# Patient Record
Sex: Female | Born: 1975 | Race: White | Hispanic: No | Marital: Married | Smoking: Former smoker
Health system: Southern US, Community
[De-identification: ages and names within clinical notes are randomized; demographics above are authoritative.]

## PROBLEM LIST (undated history)

## (undated) ENCOUNTER — Emergency Department (HOSPITAL_COMMUNITY): Admission: EM | Payer: Self-pay | Source: Home / Self Care

## (undated) DIAGNOSIS — R079 Chest pain, unspecified: Secondary | ICD-10-CM

## (undated) DIAGNOSIS — E785 Hyperlipidemia, unspecified: Secondary | ICD-10-CM

## (undated) DIAGNOSIS — Z803 Family history of malignant neoplasm of breast: Secondary | ICD-10-CM

## (undated) DIAGNOSIS — N809 Endometriosis, unspecified: Secondary | ICD-10-CM

## (undated) DIAGNOSIS — B019 Varicella without complication: Secondary | ICD-10-CM

## (undated) DIAGNOSIS — Z8041 Family history of malignant neoplasm of ovary: Secondary | ICD-10-CM

## (undated) HISTORY — DX: Family history of malignant neoplasm of breast: Z80.3

## (undated) HISTORY — DX: Endometriosis, unspecified: N80.9

## (undated) HISTORY — DX: Chest pain, unspecified: R07.9

## (undated) HISTORY — DX: Varicella without complication: B01.9

## (undated) HISTORY — DX: Family history of malignant neoplasm of ovary: Z80.41

## (undated) HISTORY — DX: Hyperlipidemia, unspecified: E78.5

---

## 2004-05-10 ENCOUNTER — Inpatient Hospital Stay (HOSPITAL_COMMUNITY): Admission: AD | Admit: 2004-05-10 | Discharge: 2004-05-10 | Payer: Self-pay | Admitting: Obstetrics and Gynecology

## 2004-08-01 ENCOUNTER — Emergency Department (HOSPITAL_COMMUNITY): Admission: EM | Admit: 2004-08-01 | Discharge: 2004-08-01 | Payer: Self-pay | Admitting: Emergency Medicine

## 2005-05-06 ENCOUNTER — Inpatient Hospital Stay (HOSPITAL_COMMUNITY): Admission: AD | Admit: 2005-05-06 | Discharge: 2005-05-06 | Payer: Self-pay | Admitting: Physical Therapy

## 2005-05-09 ENCOUNTER — Other Ambulatory Visit: Admission: RE | Admit: 2005-05-09 | Discharge: 2005-05-09 | Payer: Self-pay | Admitting: Obstetrics and Gynecology

## 2005-08-03 ENCOUNTER — Inpatient Hospital Stay (HOSPITAL_COMMUNITY): Admission: AD | Admit: 2005-08-03 | Discharge: 2005-08-03 | Payer: Self-pay | Admitting: Obstetrics and Gynecology

## 2005-10-26 ENCOUNTER — Inpatient Hospital Stay (HOSPITAL_COMMUNITY): Admission: AD | Admit: 2005-10-26 | Discharge: 2005-10-26 | Payer: Self-pay | Admitting: Obstetrics and Gynecology

## 2005-11-18 ENCOUNTER — Inpatient Hospital Stay (HOSPITAL_COMMUNITY): Admission: AD | Admit: 2005-11-18 | Discharge: 2005-11-22 | Payer: Self-pay | Admitting: Obstetrics and Gynecology

## 2006-01-01 ENCOUNTER — Other Ambulatory Visit: Admission: RE | Admit: 2006-01-01 | Discharge: 2006-01-01 | Payer: Self-pay | Admitting: Obstetrics and Gynecology

## 2006-09-11 ENCOUNTER — Emergency Department (HOSPITAL_COMMUNITY): Admission: EM | Admit: 2006-09-11 | Discharge: 2006-09-11 | Payer: Self-pay | Admitting: Family Medicine

## 2006-09-14 ENCOUNTER — Emergency Department (HOSPITAL_COMMUNITY): Admission: EM | Admit: 2006-09-14 | Discharge: 2006-09-15 | Payer: Self-pay | Admitting: Emergency Medicine

## 2007-12-19 ENCOUNTER — Inpatient Hospital Stay (HOSPITAL_COMMUNITY): Admission: AD | Admit: 2007-12-19 | Discharge: 2007-12-19 | Payer: Self-pay | Admitting: Obstetrics and Gynecology

## 2008-01-07 ENCOUNTER — Emergency Department (HOSPITAL_COMMUNITY): Admission: EM | Admit: 2008-01-07 | Discharge: 2008-01-07 | Payer: Self-pay | Admitting: Emergency Medicine

## 2008-06-16 ENCOUNTER — Inpatient Hospital Stay (HOSPITAL_COMMUNITY): Admission: AD | Admit: 2008-06-16 | Discharge: 2008-06-20 | Payer: Self-pay | Admitting: Obstetrics and Gynecology

## 2009-05-25 ENCOUNTER — Emergency Department (HOSPITAL_COMMUNITY): Admission: EM | Admit: 2009-05-25 | Discharge: 2009-05-25 | Payer: Self-pay | Admitting: Family Medicine

## 2009-06-03 ENCOUNTER — Emergency Department (HOSPITAL_COMMUNITY): Admission: EM | Admit: 2009-06-03 | Discharge: 2009-06-03 | Payer: Self-pay | Admitting: Emergency Medicine

## 2011-05-08 NOTE — Op Note (Signed)
Lindsay Sharp, Lindsay Sharp               ACCOUNT NO.:  [id number]   MEDICAL RECORD NO.:  [id number]          PATIENT TYPE:  INP   LOCATION:  9132                          FACILITY:  [facility]   PHYSICIAN:  Dineen Kid. Rana Snare, M.D.    DATE OF BIRTH:  [birth year]   DATE OF PROCEDURE:  DATE OF DISCHARGE:                               OPERATIVE REPORT   PREOPERATIVE DIAGNOSES:  Intrauterine pregnancy at 43 weeks' gestational  age, previous cesarean section, desired repeat breech presentation and  labor.   POSTOPERATIVE DIAGNOSES:  Intrauterine pregnancy at 57 weeks'  gestational age, previous cesarean section, desired repeat breech  presentation and labor.   PROCEDURE:  Repeat low-segment transverse cesarean section.   SURGEON:  Dineen Kid. Rana Snare, M.D.   ANESTHESIA:  Spinal.   INDICATIONS:  [name] is a 35 year old G3, P1 who presented in labor  with regular contractions, previous cesarean section, and breech,  desires repeat cesarean section.  Risks and benefits were discussed and  informed consent was obtained.   FINDINGS AT THE TIME OF SURGERY:  A viable female infant, Apgars were 8  and 8, and pH arterial 7.31.   DESCRIPTION OF PROCEDURE:  After adequate analgesia, the patient placed  in the supine position, left lateral tilt.  She was sterilely prepped  and draped.  The bladder was thoroughly drained with a Foley catheter.  The previous scar tissue was easily excised in an elliptical fashion.  Pfannenstiel skin incision was then taken down sharply to the fascia  which was incised transversely, extended superiorly and inferiorly off  the bellies of rectus muscle, which were separated and sharply in  midline.  The peritoneum was entered sharply.  Bladder flap created and  placed behind the bladder blade.  Low-segment myotomy incision was made  down to the amniotic sac.  Amniotomy was performed.  The infant's  buttocks were grasped, delivered in the frank breech position.  Arms  were easily  reduced, the hip delivered.  Atraumatically, the nares and  pharynx were then suctioned.  Cord clamped, cut, and the infant was  handed to the pediatricians for resuscitation.  Cord blood was obtained.  Placenta extracted manually.  Uterus was exteriorized, wiped clean with  a dry lap.  The myotomy incision was closed in 2 layers, the first being  running locking layer, the second being imbricating layer of suture of 0  Monocryl.  The uterus was placed back into the peritoneal cavity and  after copious amount of irrigation, adequate hemostasis was assured.  The peritoneum was closed with 0 Monocryl.  Rectus muscle plicated at  the midline.  Irrigation was applied and after adequate hemostasis, the  fascia was closed with #1 Vicryl in running fashion.  Irrigation was  applied and after adequate  hemostasis, skin staples, and Steri-Strips were applied.  The patient  tolerated the procedure well, was stable on transfer to recovery room.  Sponge and instrument count were normal x3.  Estimated blood loss was  900 mL.  The patient did receive 900 mg of clindamycin preoperatively.      Dineen Kid Rana Snare, M.D.  Electronically Signed     DCL/MEDQ  D:  06/16/2008  T:  06/17/2008  Job:  [id number]

## 2011-05-08 NOTE — H&P (Signed)
Lindsay Sharp, DISS               ACCOUNT NO.:  [id number]   MEDICAL RECORD NO.:  [id number]          PATIENT TYPE:  INP   LOCATION:  9132                          [facility]   PHYSICIAN:  Dineen Kid. Rana Snare, M.D.    DATE OF BIRTH:  1976-08-06   DATE OF ADMISSION:  06/16/2008  DATE OF DISCHARGE:                              HISTORY & PHYSICAL   HISTORY OF PRESENT ILLNESS:  Ms. Squyres is a 35 year old G3, P1 at [redacted]  weeks gestational age, who presents in labor, contractions, and lower  back pain.  She has had a previous C-section, desires repeat.  She also  has breech presentation and is having regular contractions.  Her  estimated date of confinement is July 02, 2008.  She has had an  uncomplicated pregnancy.   PAST MEDICAL HISTORY:  Negative.   PAST SURGICAL HISTORY:  She has a history endometriosis and previous  laparoscopies.  Previous cesarean section failed to progress.   MEDICATIONS:  Prenatal vitamins.   ALLERGIES:  She is allergic to PENICILLIN and ERYTHROMYCIN.   PHYSICAL EXAMINATION:  Her blood pressure is 110/60.  Heart is regular  rate and rhythm.  Lungs, clear to auscultation bilaterally.  Abdomen is  gravid, nontender.  Cervix is closed, soft, 50% effaced.   IMPRESSION:  Intrauterine pregnancy at 38 weeks.  Previous cesarean  section, desired repeat and also breech presentation, now in early  labor.   PLAN:  Repeat low-segment transverse cesarean section.  Risks and  benefits were discussed at length.  Informed consent was obtained.      Dineen Kid Rana Snare, M.D.  Electronically Signed     DCL/MEDQ  D:  06/16/2008  T:  06/17/2008  Job:  [id number]

## 2011-05-11 NOTE — Discharge Summary (Signed)
Lindsay Sharp, Lindsay Sharp               ACCOUNT NO.:  [id number]   MEDICAL RECORD NO.:  [id number]          PATIENT TYPE:  INP   LOCATION:  9134                          [facility]   PHYSICIAN:  Michelle L. Grewal, M.D.DATE OF BIRTH:  02-22-1976   DATE OF ADMISSION:  11/18/2005  DATE OF DISCHARGE:  11/22/2005                                 DISCHARGE SUMMARY   ADMITTING DIAGNOSES:  1.  Intrauterine pregnancy at term.  2.  Spontaneous onset of labor.   DISCHARGE DIAGNOSES:  1.  Status post low transverse cesarean section secondary to failure to      progress and cephalopelvic disproportion.  2.  Viable female infant.   PROCEDURE:  Primary low transverse cesarean section.   REASON FOR ADMISSION:  Please see written H&P.   HOSPITAL COURSE:  The patient was a 35 year old gravida 2 para 0 that was  admitted to [facility] [place] with spontaneous onset of labor. The  patient was known to have positive group B beta strep and IV antibiotics  were administered prophylactically. On admission, vital signs were stable.  Contractions were noted to be irregular. Fetal heart tones were in the 140s  with accelerations. Cervix was noted to be 2 cm dilated, 50% effaced, vertex  at -3 station. Artificial rupture of membranes was performed which revealed  clear fluid. IV Pitocin was started to augment her labor. Epidural was  placed for the patient's comfort. The patient did progress to approximately  6 cm, 90%, with vertex molding at -1 to -2 station. Intrauterine pressure  catheter had been applied earlier in the day and had been noted to have  adequate labor for approximately 4 hours without progression of her labor.  Decision was made to proceed with a primary low transverse cesarean section.  The patient was then transferred to the operating room where epidural was  dosed to an adequate surgical level. A low transverse incision was made with  the delivery of a viable female weighing 8  pounds 0 ounces with Apgars of 8  at one minute and 9 at five minutes. Arterial cord pH was 7.31. The patient  tolerated the procedure well and was taken to the recovery room in stable  condition. On postoperative day #1 the patient was without complaint. Vital  signs were stable. Abdomen was soft with good return of bowel function.  Fundus was firm and nontender. Abdominal dressing was noted to have a small  amount of old drainage noted on the bandage. Laboratory findings revealed  hemoglobin of 11.3, a WBC count of 17.8. On postoperative day #2 the patient  was without complaint. Vital signs were stable. Abdomen was soft, fundus  firm and nontender. Abdominal dressing had been removed revealing an  incision that was clean, dry and intact. The patient was ambulating well and  tolerating a regular diet without complaints of nausea and vomiting. On  postoperative day #3 the patient was doing well. She was without complaint.  Vital signs remained stable. She was afebrile. Fundus was firm and  nontender. Incision was clean, dry and intact. Staples were removed and  the  patient was discharged home.   CONDITION ON DISCHARGE:  Good.   DIET:  Regular as tolerated.   ACTIVITY:  No heavy lifting, no driving x2 weeks, no vaginal entry.   FOLLOW UP:  The patient is to follow up in the office in 1 week for an  incision check. She is to call for temperature greater than 100 degrees,  persistent nausea and vomiting, heavy vaginal bleeding, and/or redness or  drainage from the incisional site.   DISCHARGE MEDICATIONS:  1.  Tylox #30 one p.o. every 4-6 hours p.r.n.  2.  Motrin 600 mg every 6 hours.  3.  Prenatal vitamins one p.o. daily.  4.  Colace one p.o. daily p.r.n.      Julio Sicks, N.P.      Stann Mainland. Vincente Poli, M.D.  Electronically Signed    CC/MEDQ  D:  01/10/2006  T:  01/10/2006  Job:  [id number]

## 2011-05-11 NOTE — Discharge Summary (Signed)
Lindsay Sharp, Lindsay Sharp               ACCOUNT NO.:  [id number]   MEDICAL RECORD NO.:  [id number]          PATIENT TYPE:  INP   LOCATION:  9132                          [facility]   PHYSICIAN:  Zelphia Cairo, MD    DATE OF BIRTH:  1976/04/06   DATE OF ADMISSION:  06/16/2008  DATE OF DISCHARGE:  06/20/2008                               DISCHARGE SUMMARY   ADMITTING DIAGNOSES:  1. Intrauterine pregnancy at 70 weeks estimated gestational age.  2. Previous cesarean section, desires repeat.  3. Breech presentation.  4. Spontaneous onset of labor.  5. History of postpartum depression.   DISCHARGE DIAGNOSES:  1. Status post low transverse cesarean section.  2. Viable female infant.   PROCEDURE:  Repeat low transverse cesarean section.   REASON FOR ADMISSION:  Please see written H&P.   HOSPITAL COURSE:  The patient was a 35 year old gravida 3, para 1 that  presented to [facility] at 38 weeks estimated  gestational age with spontaneous onset of labor.  The patient had had a  previous cesarean section and desired repeat.  The patient also was  noted to have a breech presentation.  The patient was then transferred  to the operating room where spinal anesthesia was administered without  difficulty.  A low transverse incision was made with delivery of a  viable female infant weighing 7 pounds 3 ounces with Apgars of 8 at 1  minute and 8 at 5 minutes.  Arterial cord pH was 7.31.  The patient  tolerated the procedure well and was taken to the recovery room in  stable condition.  On postoperative day #1, the patient was without  complaint.  She did state the baby was in the NICU on antibiotics on  room air.  Vital signs were stable.  She was afebrile.  Abdomen soft.  Abdominal dressing was noted to be clean, dry, and intact.  Laboratory  findings revealed a hemoglobin of 10.3.  On postoperative day #2, the  patient was without complaint.  Baby was stable in the NICU with  diagnosis of tachypnea.  Vital signs were stable.  She was afebrile.  Fundus firm and nontender.  Incision was clean, dry, and intact.  On  postoperative day #3, the patient was without complaint.  She denied any  pain or nausea and vomiting.  Baby continued to be in the NICU, stable.  Vital signs were stable.  Abdomen, soft.  She was ambulating well.  On  postoperative day #4, the patient was without complaint.  Vital signs  were stable.  She was afebrile.  Abdomen, soft.  Fundus firm and  nontender.  Incision was clean, dry, and intact.  Staples removed and  the patient was later discharged home.   CONDITION ON DISCHARGE:  Stable.   DIET:  Regular as tolerated.   ACTIVITY:  No heavy lifting.  No driving x2 weeks.  No vaginal entry.   FOLLOW UP:  The patient will follow up in the office in 1-2 weeks for  incision check.  She is to call for temperature greater than 100  degrees, persistent  nausea, vomiting, and heavy vaginal bleeding and/or  redness or drainage from the incisional site.   DISCHARGE MEDICATIONS:  1. Percocet 5/325 #30, one p.o. every 4-6 h. p.r.n.  2. Zoloft 50 mg one p.o. daily.  3. Prenatal vitamins one p.o. daily.  4. Ibuprofen 600 mg every 6 hours as needed.      Julio Sicks, N.P.      Zelphia Cairo, MD  Electronically Signed    CC/MEDQ  D:  07/11/2008  T:  07/12/2008  Job:  [id number]

## 2011-05-11 NOTE — Op Note (Signed)
Lindsay Sharp, Lindsay Sharp               ACCOUNT NO.:  [id number]   MEDICAL RECORD NO.:  [id number]          PATIENT TYPE:  INP   LOCATION:  9134                          [facility]   PHYSICIAN:  Freddy Finner, M.D.   DATE OF BIRTH:  October 08, 1976   DATE OF PROCEDURE:  11/19/2005  DATE OF DISCHARGE:                                 OPERATIVE REPORT   PREOPERATIVE DIAGNOSES:  1.  Intrauterine pregnancy at term.  2.  Failure to progress in labor due to cephalopelvic disproportion.   POSTOPERATIVE DIAGNOSES:  1.  Intrauterine pregnancy at term.  2.  Failure to progress in labor due to cephalopelvic disproportion.   OPERATIVE PROCEDURE:  Primary low transverse cervical cesarean section with  delivery of viable female infant, Apgars of 8 at one minute, 9 at five  minutes, arterial cord pH of 7.31.   ANESTHESIA:  Epidural.   ESTIMATED INTRAOPERATIVE BLOOD LOSS:  Less than or equal to 800 mL.   INTRAOPERATIVE COMPLICATIONS:  None.   Patient is a 35 year old who presented in early labor.  She is known to be  group B Strep positive.  She was treated with clindamycin IV intrapartum.  She was augmented with intravenous Pitocin and using intrauterine pressure  transducer was confirmed to have adequate labor of at least 200 [place]  units per hour for at least four hours.  Over that period of time she made  minimal cervical change and there was molding of the fetal vertex at a -1 to  -2 station.  Based on these findings it was elected to proceed with cesarean  delivery.  Epidural was then placed.  Foley catheter was then placed.  She  was brought to the operating room, dosed for surgery.  The abdomen was  prepped and draped in the usual fashion.  Low abdominal transverse incision  was made and carried sharply down to the fascia.  Fascia was entered sharply  and extended to the extent of skin incision.  Rectus sheath was developed  superiorly and inferiorly with blunt and sharp dissection.   Subcutaneous and  subfascial vessels were controlled with the Bovie.  Rectus muscles divided  in the midline.  Peritoneum was elevated and entered sharply and extended  bluntly to the extent of the skin incision.  Bladder blade was placed.  Transverse incision was made in the visceroperitoneum overlying the lower  uterine segment and the bladder bluntly dissected off the lower segment.  Transverse incision was made in the lower uterine segment, extended bluntly  in a transverse direction.  KIWI vacuum extractor was required for easy  delivery of the infant's head.  Single nuchal cord was reduced.  Apgars and  cord pH were noted above.  The shoulders were delivered without difficulty.  Cord blood was obtained for routine venous sampling for arterial sample.  The cord and placenta were donated for stem cell harvest.  Uterine cavity  was confirmed completely evacuated by manual exploration.  Uterus was  delivered onto the anterior abdominal wall.  Tubes and ovaries were normal  as was the uterus.  Uterine incision was  closed in a double layer with  running locking 0 Monocryl for the first layer and embrocating suture of 0  Monocryl for the second layer.  This effectively reapproximated the bladder  flap also.  Uterus, tubes, and ovaries were placed back into the abdominal  cavity.  Irrigation was carried out.  Hemostasis was complete.  All pack,  needle, and instrument counts were correct.  Abdominal incision was closed  in layers.  0 Monocryl was used to close the peritoneum and reapproximate  the  rectus muscles.  Fascia was closed with running 0 PDS.  Subcutaneous tissue  was approximated with running 2-0 plain.  Skin was closed with wide skin  staples and 1/4-inch Steri-Strips.  Patient tolerated the procedure well,  was taken to the recovery room in good condition.      Freddy Finner, M.D.  Electronically Signed     WRN/MEDQ  D:  11/20/2005  T:  11/20/2005  Job:  [id number]

## 2011-09-13 LAB — URINE MICROSCOPIC-ADD ON

## 2011-09-13 LAB — DIFFERENTIAL
Basophils Absolute: 0
Basophils Relative: 0
Eosinophils Absolute: 0.1
Eosinophils Relative: 1
Lymphocytes Relative: 27
Lymphs Abs: 3.3
Monocytes Absolute: 0.6
Monocytes Relative: 5
Neutro Abs: 8.2 — ABNORMAL HIGH

## 2011-09-13 LAB — I-STAT 8, (EC8 V) (CONVERTED LAB)
Bicarbonate: 22.8
HCT: 38
Hemoglobin: 12.9
Operator id: 234501
TCO2: 24
pCO2, Ven: 38.4 — ABNORMAL LOW

## 2011-09-13 LAB — URINALYSIS, ROUTINE W REFLEX MICROSCOPIC
Bilirubin Urine: NEGATIVE
Nitrite: NEGATIVE
Protein, ur: NEGATIVE
Specific Gravity, Urine: 1.015
Urobilinogen, UA: 0.2

## 2011-09-13 LAB — POCT I-STAT CREATININE
Creatinine, Ser: 0.7
Operator id: 234501

## 2011-09-13 LAB — CBC
RBC: 3.92
RDW: 13
WBC: 12.3 — ABNORMAL HIGH

## 2011-09-20 LAB — CBC
HCT: 29.2 — ABNORMAL LOW
HCT: 36.8
Hemoglobin: 10.3 — ABNORMAL LOW
Hemoglobin: 12.9
MCHC: 35
MCHC: 35.3
MCV: 92.7
MCV: 93.2
Platelets: 155
Platelets: 195
RBC: 3.14 — ABNORMAL LOW
RBC: 3.98
RDW: 12.6
RDW: 12.7
WBC: 8.3
WBC: 8.6

## 2011-09-20 LAB — RPR: RPR Ser Ql: NONREACTIVE

## 2011-09-28 LAB — URINALYSIS, ROUTINE W REFLEX MICROSCOPIC
Hgb urine dipstick: NEGATIVE
Ketones, ur: NEGATIVE
Protein, ur: NEGATIVE
Specific Gravity, Urine: 1.02

## 2012-09-26 ENCOUNTER — Ambulatory Visit (HOSPITAL_COMMUNITY)
Admission: RE | Admit: 2012-09-26 | Discharge: 2012-09-26 | Disposition: A | Discharge status: Home / Self Care | Payer: BC Managed Care – PPO | Source: Ambulatory Visit | Attending: *Deleted | Admitting: *Deleted

## 2012-09-26 ENCOUNTER — Other Ambulatory Visit (HOSPITAL_COMMUNITY): Payer: Self-pay | Admitting: *Deleted

## 2012-09-26 ENCOUNTER — Other Ambulatory Visit (HOSPITAL_COMMUNITY): Payer: Self-pay | Admitting: Family Medicine

## 2012-09-26 DIAGNOSIS — R109 Unspecified abdominal pain: Secondary | ICD-10-CM | POA: Insufficient documentation

## 2012-09-26 DIAGNOSIS — R1084 Generalized abdominal pain: Secondary | ICD-10-CM

## 2012-09-26 MED ORDER — IOHEXOL 300 MG/ML  SOLN
100.0000 mL | Freq: Once | INTRAMUSCULAR | Status: AC | PRN
  Administered 2012-09-26: 100 mL via INTRAVENOUS

## 2012-09-26 NOTE — ED Notes (Signed)
Not in triage, unable to find.

## 2012-09-26 NOTE — ED Notes (Signed)
Pt waiting on CT. Ct aware and is going to look at order

## 2012-09-29 ENCOUNTER — Other Ambulatory Visit (HOSPITAL_COMMUNITY): Payer: Self-pay

## 2013-05-13 ENCOUNTER — Inpatient Hospital Stay (HOSPITAL_COMMUNITY)
Admission: AD | Admit: 2013-05-13 | Discharge: 2013-05-13 | Discharge status: Against Medical Advice | Payer: BC Managed Care – PPO | Source: Ambulatory Visit | Attending: Obstetrics and Gynecology | Admitting: Obstetrics and Gynecology

## 2013-05-13 NOTE — MAU Note (Signed)
Not in lobby

## 2013-09-19 ENCOUNTER — Emergency Department: Payer: Self-pay | Admitting: Emergency Medicine

## 2013-09-19 LAB — URINALYSIS, COMPLETE
Ketone: NEGATIVE
Nitrite: NEGATIVE
Protein: NEGATIVE
RBC,UR: <1 /HPF (ref 0–5)
Squamous Epithelial: 4

## 2014-08-27 ENCOUNTER — Encounter: Payer: Self-pay | Admitting: Cardiovascular Disease

## 2014-08-27 ENCOUNTER — Ambulatory Visit (INDEPENDENT_AMBULATORY_CARE_PROVIDER_SITE_OTHER): Payer: BC Managed Care – PPO | Admitting: Cardiovascular Disease

## 2014-08-27 VITALS — BP 124/84 | HR 59 | Ht 68.0 in | Wt 176.0 lb

## 2014-08-27 DIAGNOSIS — R079 Chest pain, unspecified: Secondary | ICD-10-CM | POA: Insufficient documentation

## 2014-08-27 DIAGNOSIS — E785 Hyperlipidemia, unspecified: Secondary | ICD-10-CM | POA: Insufficient documentation

## 2014-08-27 DIAGNOSIS — R072 Precordial pain: Secondary | ICD-10-CM

## 2014-08-27 NOTE — Assessment & Plan Note (Signed)
On statin therapy followed by her PCP 

## 2014-08-27 NOTE — Patient Instructions (Signed)
Your physician recommends that you schedule a follow-up appointment in: 6 Months  Your physician has requested that you have an exercise tolerance test. For further information please visit [email or web address] Please also follow instruction sheet, as given.

## 2014-08-27 NOTE — Progress Notes (Signed)
08/27/2014 Lindsay Sharp   09-Dec-1976  [id number]  Primary Physician No PCP Per Patient Primary Cardiologist: Lindsay Gess MD Roseanne Reno   HPI:  Ms. 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 She was referred by [facility]  urgent care for cardiovascular evaluation because of chest pain and positive risk factors. Her cardiovascular risk factor profile is notable for 10 pack years of tobacco abuse having quit 10 years ago. History of hyperlipidemia. There is no family history of heart disease. She has never had a heart attack or stroke. She's had chest pain for last 2 years occurring several times a month lasting one to 2 minutes at a time. The pain is left inframammary without other associated symptoms.   Current Outpatient Prescriptions  Medication Sig Dispense Refill  . aspirin 81 MG tablet Take 81 mg by mouth daily.      Marland Kitchen atorvastatin (LIPITOR) 40 MG tablet Take 40 mg by mouth daily.      . Coenzyme Q10 (CO Q-10) 200 MG CAPS Take 200 mg by mouth daily.      . norethindrone-ethinyl estradiol (JUNEL FE,GILDESS FE,LOESTRIN FE) 1-20 MG-MCG tablet Take 1 tablet by mouth daily.      . Omega-3 Fatty Acids (FISH OIL) 1000 MG CAPS Take 2,000 mg by mouth daily.      . polyethylene glycol (MIRALAX / GLYCOLAX) packet Take 17 g by mouth daily.       No current facility-administered medications for this visit.    Allergies  Allergen Reactions  . Erythromycin Nausea And Vomiting  . Penicillins Hives    History   Social History  . Marital Status: Married    Spouse Name: N/A    Number of Children: N/A  . Years of Education: N/A   Occupational History  . Not on file.   Social History Main Topics  . Smoking status: Former Smoker    Quit date: 08/27/2004  . Smokeless tobacco: Not on file  . Alcohol Use: Not on file  . Drug Use:  Not on file  . Sexual Activity: Not on file   Other Topics Concern  . Not on file   Social History Narrative  . No narrative on file     Review of Systems: General: negative for chills, fever, night sweats or weight changes.  Cardiovascular: negative for chest pain, dyspnea on exertion, edema, orthopnea, palpitations, paroxysmal nocturnal dyspnea or shortness of breath Dermatological: negative for rash Respiratory: negative for cough or wheezing Urologic: negative for hematuria Abdominal: negative for nausea, vomiting, diarrhea, bright red blood per rectum, melena, or hematemesis Neurologic: negative for visual changes, syncope, or dizziness All other systems reviewed and are otherwise negative except as noted above.    Blood pressure 124/84, pulse 59, height  (1.727 m), weight 176 lb (79.833 kg).  General appearance: alert and no distress Neck: no adenopathy, no carotid bruit, no JVD, supple, symmetrical, trachea midline and thyroid not enlarged, symmetric, no tenderness/mass/nodules Lungs: clear to auscultation bilaterally Heart: regular rate and rhythm, S1, S2 normal, no murmur, click, rub or gallop Extremities: extremities normal, atraumatic, no cyanosis or edema and 2+ pedal pulses bilaterally  EKG sinus bradycardia at 59 without ST or T wave changes  ASSESSMENT AND PLAN:   Hyperlipidemia On statin therapy followed by her PCP  Chest pain 38 year old female with hyperlipidemia and chest  pain for last 2 years occurring several times a month. The pain is left inframammary and lasting several minutes at a time. Nothing in particular brings it on. There has been occasional left upper extremity radiation. I think her prior probability of CAD as low, I believe it's worth pursuing an exercise stress test.      Lindsay Gess MD [facility] 08/27/2014 8:29 AM

## 2014-08-27 NOTE — Assessment & Plan Note (Signed)
38 year old female with hyperlipidemia and chest pain for last 2 years occurring several times a month. The pain is left inframammary and lasting several minutes at a time. Nothing in particular brings it on. There has been occasional left upper extremity radiation. I think her prior probability of CAD as low, I believe it's worth pursuing an exercise stress test.

## 2014-11-03 ENCOUNTER — Encounter (HOSPITAL_COMMUNITY): Payer: BC Managed Care – PPO

## 2014-12-01 ENCOUNTER — Ambulatory Visit: Payer: BC Managed Care – PPO | Admitting: Cardiovascular Disease

## 2015-01-14 ENCOUNTER — Telehealth: Payer: Self-pay | Admitting: Genetic Counselor

## 2015-01-14 NOTE — Telephone Encounter (Signed)
LEFT MESSAGE FOR PATIENT TO RETURN CALL TO SCHEDULE GENETIC APPT.  °

## 2015-01-14 NOTE — Telephone Encounter (Signed)
S/W PATIIENT AND GAVE GENETIC APPT FOR 02/22 @ 2 Mariea ClontsW/KAREN Lowell GuitarPOWELL

## 2015-02-07 ENCOUNTER — Encounter: Payer: Self-pay | Admitting: Genetic Counselor

## 2015-02-07 ENCOUNTER — Other Ambulatory Visit: Payer: BC Managed Care – PPO

## 2015-02-07 ENCOUNTER — Ambulatory Visit (HOSPITAL_BASED_OUTPATIENT_CLINIC_OR_DEPARTMENT_OTHER): Payer: BC Managed Care – PPO | Admitting: Genetic Counselor

## 2015-02-07 DIAGNOSIS — Z315 Encounter for genetic counseling: Secondary | ICD-10-CM

## 2015-02-07 DIAGNOSIS — Z803 Family history of malignant neoplasm of breast: Secondary | ICD-10-CM

## 2015-02-07 DIAGNOSIS — Z808 Family history of malignant neoplasm of other organs or systems: Secondary | ICD-10-CM

## 2015-02-07 DIAGNOSIS — N809 Endometriosis, unspecified: Secondary | ICD-10-CM | POA: Insufficient documentation

## 2015-02-07 DIAGNOSIS — Z8041 Family history of malignant neoplasm of ovary: Secondary | ICD-10-CM

## 2015-02-07 NOTE — Progress Notes (Signed)
REFERRING PROVIDER: Luz Lex, MD [address] [address] [place], [place] [address]  PRIMARY PROVIDER:  No PCP Per Patient  PRIMARY REASON FOR VISIT:  1. Family history of breast cancer   2. Family history of ovarian cancer   3. Family history of melanoma      HISTORY OF PRESENT ILLNESS:   Lindsay Sharp, a 39 y.o. female, was seen for a [place] cancer genetics consultation at the request of Dr. Corinna Capra due to a family history of cancer.  Lindsay Sharp presents to clinic today to discuss the possibility of a hereditary predisposition to cancer, genetic testing, and to further clarify her future cancer risks, as well as potential cancer risks for family members.   Lindsay Sharp has no personal history of cancer.  She was diagnosed with endometriosis, and her physician has discussed a hysterectomy with her based on this diagnosis.  CANCER HISTORY:   No history exists.     HORMONAL RISK FACTORS:  Menarche was at age 21.  First live birth at age 33.  OCP use for approximately 17 years.  Ovaries intact: yes.  Hysterectomy: no.  Menopausal status: premenopausal.  HRT use: 0 years. Colonoscopy: scheduled for February 22, 2015 due to fullness on her right side Mammogram within the last year: Mammogram 1 year ago. Number of breast biopsies: 0. Up to date with pelvic exams:  yes. Any excessive radiation exposure in the past:  no  Past Medical History  Diagnosis Date  . Hyperlipidemia   . Chest pain   . Family history of ovarian cancer   . Family history of breast cancer   . Endometriosis     Past Surgical History  Procedure Laterality Date  . Cesarean section  2006 and 2009    History   Social History  . Marital Status: Married    Spouse Name: Carloyn Manner  . Number of Children: 2  . Years of Education: N/A   Social History Main Topics  . Smoking status: Former Smoker -- 1.00 packs/day for 10 years    Types: Cigarettes    Quit date: 08/27/2004  . Smokeless tobacco: Not on  file  . Alcohol Use: Yes     Comment: 1-2 glasses/week  . Drug Use: Not on file  . Sexual Activity: Not on file   Other Topics Concern  . None   Social History Narrative     FAMILY HISTORY:  We obtained a detailed, 4-generation family history.  Significant diagnoses are listed below: Family History  Problem Relation Age of Onset  . Hyperlipidemia Father   . Melanoma Father     dx in his 16s  . Hyperlipidemia Paternal Grandfather   . Breast cancer Paternal Aunt     dbl mastectomy; dx in her 7s  . Brain cancer Maternal Grandmother     dx in her 55s  . Prostate cancer Maternal Grandfather 57  . Ovarian cancer Paternal Grandmother     dx in her 75s   The patient has a paternal half brother who is cancer free.  Her mother had one brother who died in infancy.  Her maternal grandmother died in her 86s from a brain tumor and her grandfather at 42 from prostate cancer.  Her father was diagnosed with melanoma in his 110s.  He has two sisters and one brother.  One sister was diagnosed two years ago with breast cancer in one breast.  She had chemo and radiaiton and had a double mastectomy.  This  year her cancer has returned, but it is unknown if she has a metastises or if she has a second primary cancer.  Lindsay Sharp paternal grandmother had ovarian cancer in her 3s.  Her grandfather did not have cancer. Patient's maternal ancestors are of [place] descent, and paternal ancestors are of [place] descent. There is no reported Ashkenazi Jewish ancestry. There is no known consanguinity.  GENETIC COUNSELING ASSESSMENT: Lindsay Sharp is a 39 y.o. female with a family history of breast and ovarian cancer which somewhat suggestive of a hereditary cancer syndrome and predisposition to cancer. We, therefore, discussed and recommended the following at today's visit.   DISCUSSION: We reviewed the characteristics, features and inheritance patterns of hereditary cancer syndromes. We reviewed hereditary  cancer syndromes associated with breast and ovarian cancer, specifically BRCA mutations.  We also reviewed other hereditary breast/ovarian cancer syndromes.  We also discussed genetic testing, including the appropriate family members to test, the process of testing, insurance coverage and turn-around-time for results. We discussed the implications of a negative, positive and/or variant of uncertain significant result. We recommended Lindsay Sharp pursue genetic testing for the Breast/Ovarian cancer gene panel. The breast/ovarian cancer gene panel offered by [facility] examines the following 21 genes through sequencing and deletion/duplication analysis: ATM, BARD1, BRCA1, BRCA2, BRIP1, CDH1, CHEK2, EPCAM, FANCC, MLH1, MSH2, MSH6, NBN, PALB2, PMS2, PTEN, RAD51C, RAD51D, STK11, TP53, and XRCC2.    In order to estimate her chance of having a BRCA mutation, we used statistical models ([employer]) and laboratory data that take into account her personal medical history, family history and ancestry.  Because each model is different, there can be a lot of variability in the risks they give.  Therefore, these numbers must be considered a rough range and not a precise risk of having a BRCA mutation.  These models estimate that she has approximately a 2-2.4% chance of having a mutation. Based on this assessment of her family and personal history, genetic testing is recommended.  Based on the patient's personal and family history, statistical models (Tyrer Cusik)  and literature data were used to estimate her risk of developing breast cancer. These estimate her lifetime risk of developing breast cancer to be approximately 19.8%. This estimation does not take into account any genetic testing results.  The patient's lifetime breast cancer risk is a preliminary estimate based on available information using one of several models endorsed by the [place] (ACS). The ACS recommends consideration of breast MRI  screening as an adjunct to mammography for patients at high risk (defined as 20% or greater lifetime risk). A more detailed breast cancer risk assessment can be considered, if clinically indicated.   PLAN: After considering the risks, benefits, and limitations,Lindsay Sharp  provided informed consent to pursue genetic testing and the blood sample was sent to [facility] for analysis of the Breast/Ovarian gene panel. Results should be available within approximately 3-4 weeks' time, at which point they will be disclosed by telephone to Lindsay Sharp, as will any additional recommendations warranted by these results. Lindsay Sharp will receive a summary of her genetic counseling visit and a copy of her results once available. This information will also be available in Epic. We encouraged Lindsay Sharp to remain in contact with cancer genetics annually so that we can continuously update the family history and inform her of any changes in cancer genetics and testing that may be of benefit for her family. Lindsay Sharp questions were answered to her satisfaction  today. Our contact information was provided should additional questions or concerns arise.  Lastly, we encouraged Lindsay Sharp to remain in contact with cancer genetics annually so that we can continuously update the family history and inform her of any changes in cancer genetics and testing that may be of benefit for this family.   Ms.  Sharp questions were answered to her satisfaction today. Our contact information was provided should additional questions or concerns arise. Thank you for the referral and allowing [place] to share in the care of your patient.   Neveyah Garzon P. Florene Glen, [place], [facility] Certified Genetic Counselor [email or web address] phone: [phone]  The patient was seen for a total of 60 minutes in face-to-face genetic counseling.  This patient was discussed with Drs. Magrinat, Lindi Adie and/or Burr Medico who agrees with the above.     _______________________________________________________________________ For Office Staff:  Number of people involved in session: 2 Was an Intern/ student involved with case: no

## 2015-02-14 ENCOUNTER — Encounter: Payer: BC Managed Care – PPO | Admitting: Genetic Counselor

## 2015-02-14 ENCOUNTER — Other Ambulatory Visit: Payer: BC Managed Care – PPO

## 2015-02-23 ENCOUNTER — Encounter: Payer: Self-pay | Admitting: Genetic Counselor

## 2015-02-23 ENCOUNTER — Telehealth: Payer: Self-pay | Admitting: Genetic Counselor

## 2015-02-23 DIAGNOSIS — Z1379 Encounter for other screening for genetic and chromosomal anomalies: Secondary | ICD-10-CM | POA: Insufficient documentation

## 2015-02-23 NOTE — Progress Notes (Signed)
HPI: Ms. Mauzy was previously seen in the [place] clinic due to a family history of cancer and concerns regarding a hereditary predisposition to cancer. Please refer to our prior cancer genetics clinic note for more information regarding Ms. Langlois's medical, social and family histories, and our assessment and recommendations, at the time. Ms. Yeats recent genetic test results were disclosed to her, as were recommendations warranted by these results. These results and recommendations are discussed in more detail below.  GENETIC TEST RESULTS: At the time of Ms. Stembridge's visit, we recommended she pursue genetic testing of the Breast/Ovarian cancer gene panel. The Breast/Ovarian gene panel offered by [facility] includes sequencing and rearrangement analysis for the following 21 genes:  ATM, BARD1, BRCA1, BRCA2, BRIP1, CDH1, CHEK2, EPCAM, FANCC, MLH1, MSH2, MSH6, NBN, PALB2, PMS2, PTEN, RAD51C, RAD51D, STK11, TP53, and XRCC2.   The report date is 02/22/2015.  Testing was performed at [facility] Genetic testing was normal, and did not reveal a deleterious mutation in these genes. The test report has been scanned into EPIC and is located under the Media tab.   We discussed with Ms. Prew that since the current genetic testing is not perfect, it is possible there may be a gene mutation in one of these genes that current testing cannot detect, but that chance is small. We also discussed, that it is possible that another gene that has not yet been discovered, or that we have not yet tested, is responsible for the cancer diagnoses in the family, and it is, therefore, important to remain in touch with cancer genetics in the future so that we can continue to offer Ms. Smyser the most up to date genetic testing.   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 At this time, it is unknown if this variant is associated with  increased cancer risk or if this is a normal finding, but most variants such as this get reclassified to being inconsequential. It should not be used to make medical management decisions. With time, we suspect the lab will determine the significance of this variant, if any. If we do learn more about it, we will try to contact Ms. Shidler to discuss it further. However, it is important to stay in touch with [place] periodically and keep the address and phone number up to date.   CANCER SCREENING RECOMMENDATIONS: This result is reassuring and suggests that Ms. Galeana's is not at risk for developing cancer due to an inherited predisposition associated with one of these genes. Most cancers happen by chance and this negative test, along with details of her family history, suggests that her cancer falls into this category. We, therefore, recommended she continue to follow the cancer management and screening guidelines provided by her oncology and primary providers.   RECOMMENDATIONS FOR FAMILY MEMBERS: Women in this family might be at some increased risk of developing cancer, over the general population risk, simply due to the family history of cancer. We recommended women in this family have a yearly mammogram beginning at age 39, or 77 years younger than the earliest onset of cancer, an an annual clinical breast exam, and perform monthly breast self-exams. Women in this family should also have a gynecological exam as recommended by their primary provider. All family members should have a colonoscopy by age 34.  FOLLOW-UP: Lastly, we discussed with Ms. Rollinson that cancer genetics is a rapidly advancing field and it is possible that new genetic  tests will be appropriate for her and/or her family members in the future. We encouraged her to remain in contact with cancer genetics on an annual basis so we can update her personal and family histories and let her know of advances in cancer genetics that may benefit this  family.   Our contact number was provided. Ms. Breach questions were answered to her satisfaction, and she knows she is welcome to call us at anytime with additional questions or concerns.   Roma Kayser, MS, [facility] Certified Genetic Counselor [email or web address]

## 2015-02-23 NOTE — Telephone Encounter (Signed)
Revealed negative genetic testing on the Breast/ovarian cancer panel.  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  Discussed that we will not change medical management based on this VUS.  We will follow up with her should this be reclassified.

## 2015-03-04 ENCOUNTER — Other Ambulatory Visit: Payer: Self-pay | Admitting: Gastroenterology

## 2015-03-04 DIAGNOSIS — R1031 Right lower quadrant pain: Secondary | ICD-10-CM

## 2015-03-14 ENCOUNTER — Ambulatory Visit
Admission: RE | Admit: 2015-03-14 | Discharge: 2015-03-14 | Disposition: A | Discharge status: Home / Self Care | Payer: BC Managed Care – PPO | Source: Ambulatory Visit | Attending: Gastroenterology | Admitting: Gastroenterology

## 2015-03-14 DIAGNOSIS — R1031 Right lower quadrant pain: Secondary | ICD-10-CM

## 2015-04-21 ENCOUNTER — Encounter: Payer: Self-pay | Admitting: Internal Medicine

## 2015-04-21 ENCOUNTER — Ambulatory Visit (INDEPENDENT_AMBULATORY_CARE_PROVIDER_SITE_OTHER): Payer: BC Managed Care – PPO | Admitting: Internal Medicine

## 2015-04-21 VITALS — BP 116/78 | HR 63 | Temp 98.1°F | Ht 67.0 in | Wt 173.0 lb

## 2015-04-21 DIAGNOSIS — N809 Endometriosis, unspecified: Secondary | ICD-10-CM

## 2015-04-21 DIAGNOSIS — E785 Hyperlipidemia, unspecified: Secondary | ICD-10-CM

## 2015-04-21 MED ORDER — ATORVASTATIN CALCIUM 40 MG PO TABS: 40.0000 mg | ORAL_TABLET | Freq: Every day | ORAL | Status: DC

## 2015-04-21 NOTE — Assessment & Plan Note (Signed)
Will check CMET and Lipid profile today Handout given on low fat diet Advised her to continue Lipitor, fish oil and CoQ10 Lipitor refilled today

## 2015-04-21 NOTE — Progress Notes (Signed)
Pre visit review using our clinic review tool, if applicable. No additional management support is needed unless otherwise documented below in the visit note. 

## 2015-04-21 NOTE — Progress Notes (Signed)
HPI  Pt presents to the clinic today to establish care and for management of the conditions listed below. [facility].  Flu: 10/2014 Tetanus: unsure LMP: 04/20/15 Pap Smear: 01/2015 Mammogram: 2014 Dentist: biannually  HLD: She takes Lipitor, Fish Oil and CoQ10 daily. She denies myalgias. She does try to consume a low fat diet.  Endometreosis: Causes her occasional pain. She will take Ibuprofen as needed for pain.  Past Medical History  Diagnosis Date  . Hyperlipidemia   . Chest pain   . Family history of ovarian cancer   . Family history of breast cancer   . Endometriosis     Current Outpatient Prescriptions  Medication Sig Dispense Refill  . aspirin 81 MG tablet Take 81 mg by mouth daily.    Marland Kitchen. atorvastatin (LIPITOR) 40 MG tablet Take 40 mg by mouth daily.    . Coenzyme Q10 (CO Q-10) 200 MG CAPS Take 200 mg by mouth daily.    . nitrofurantoin, macrocrystal-monohydrate, (MACROBID) 100 MG capsule Take 100 mg by mouth as needed.    . norethindrone-ethinyl estradiol (JUNEL FE,GILDESS FE,LOESTRIN FE) 1-20 MG-MCG tablet Take 1 tablet by mouth daily.    . Omega-3 Fatty Acids (FISH OIL) 1000 MG CAPS Take 2,000 mg by mouth daily.    . polyethylene glycol (MIRALAX / GLYCOLAX) packet Take 17 g by mouth daily.     No current facility-administered medications for this visit.    Allergies  Allergen Reactions  . Erythromycin Nausea And Vomiting  . Penicillins Hives    Family History  Problem Relation Age of Onset  . Hyperlipidemia Father   . Melanoma Father     dx in his 7250s  . Hyperlipidemia Paternal Grandfather   . Breast cancer Paternal Aunt     dbl mastectomy; dx in her 5550s  . Brain cancer Maternal Grandmother     dx in her 5160s  . Prostate cancer Maternal Grandfather [id number]  . Ovarian cancer Paternal Grandmother     dx in her 3360s    History   Social History  . Marital Status: Married    Spouse Name: Channing MuttersRoy  . Number of Children: 2   . Years of Education: N/A   Occupational History  . Not on file.   Social History Main Topics  . Smoking status: Former Smoker -- 1.00 packs/day for 10 years    Types: Cigarettes    Quit date: 08/27/2004  . Smokeless tobacco: Not on file  . Alcohol Use: Yes     Comment: 1-2 glasses/week  . Drug Use: Not on file  . Sexual Activity: Not on file   Other Topics Concern  . Not on file   Social History Narrative    ROS:  Constitutional: Denies fever, malaise, fatigue, headache or abrupt weight changes.  HEENT: Denies eye pain, eye redness, ear pain, ringing in the ears, wax buildup, runny nose, nasal congestion, bloody nose, or sore throat. Respiratory: Denies difficulty breathing, shortness of breath, cough or sputum production.   Cardiovascular: Denies chest pain, chest tightness, palpitations or swelling in the hands or feet.  Gastrointestinal: Denies abdominal pain, bloating, constipation, diarrhea or blood in the stool.  Skin: Denies redness, rashes, lesions or ulcercations.  Neurological: Denies dizziness, difficulty with memory, difficulty with speech or problems with balance and coordination.  Psych: Denies anxiety, depression, SI/HI.  No other specific complaints in a complete review of systems (except as listed in HPI above).  PE:  Ht  (1.702 m)  Wt 173 lb (78.472 kg)  BMI 27.09 kg/m2  LMP 04/20/2015 Wt Readings from Last 3 Encounters:  04/21/15 173 lb (78.472 kg)  08/27/14 176 lb (79.833 kg)    General: Appears her stated age, well developed, well nourished in NAD. HEENT: Head: normal shape and size; Eyes: sclera white, no icterus, conjunctiva pink, PERRLA and EOMs intact;  Cardiovascular: Normal rate and rhythm. S1,S2 noted.  No murmur, rubs or gallops noted.  Pulmonary/Chest: Normal effort and positive vesicular breath sounds. No respiratory distress. No wheezes, rales or ronchi noted.  Neurological: Alert and oriented.  Psychiatric: Mood and affect  normal. Behavior is normal. Judgment and thought content normal.    BMET    Component Value Date/Time   NA 136 01/07/2008 2050   K 3.8 01/07/2008 2050   CL 104 01/07/2008 2050   GLUCOSE 90 01/07/2008 2050   BUN 5* 01/07/2008 2050   CREATININE 0.7 01/07/2008 2049    Lipid Panel  No results found for: CHOL, TRIG, HDL, CHOLHDL, VLDL, LDLCALC  CBC    Component Value Date/Time   WBC 8.3 06/17/2008 0510   RBC 3.14* 06/17/2008 0510   HGB 10.3 DELTA CHECK NOTED* 06/17/2008 0510   HCT 29.2* 06/17/2008 0510   PLT 155 06/17/2008 0510   MCV 93.2 06/17/2008 0510   MCHC 35.3 06/17/2008 0510   RDW 12.7 06/17/2008 0510   LYMPHSABS 3.3 01/07/2008 2035   MONOABS 0.6 01/07/2008 2035   EOSABS 0.1 01/07/2008 2035   BASOSABS 0.0 01/07/2008 2035    Hgb A1C No results found for: HGBA1C   Assessment and Plan:

## 2015-04-21 NOTE — Assessment & Plan Note (Signed)
Controlled with Ibuprofen prn 

## 2015-04-21 NOTE — Patient Instructions (Signed)
Fat and Cholesterol Control Diet Fat and cholesterol levels in your blood and organs are influenced by your diet. High levels of fat and cholesterol may lead to diseases of the heart, small and large blood vessels, gallbladder, liver, and pancreas. CONTROLLING FAT AND CHOLESTEROL WITH DIET Although exercise and lifestyle factors are important, your diet is key. That is because certain foods are known to raise cholesterol and others to lower it. The goal is to balance foods for their effect on cholesterol and more importantly, to replace saturated and trans fat with other types of fat, such as monounsaturated fat, polyunsaturated fat, and omega-3 fatty acids. On average, a person should consume no more than 15 to 17 g of saturated fat daily. Saturated and trans fats are considered "bad" fats, and they will raise LDL cholesterol. Saturated fats are primarily found in animal products such as meats, butter, and cream. However, that does not mean you need to give up all your favorite foods. Today, there are good tasting, low-fat, low-cholesterol substitutes for most of the things you like to eat. Choose low-fat or nonfat alternatives. Choose round or loin cuts of red meat. These types of cuts are lowest in fat and cholesterol. Chicken (without the skin), fish, veal, and ground turkey breast are great choices. Eliminate fatty meats, such as hot dogs and salami. Even shellfish have little or no saturated fat. Have a 3 oz (85 g) portion when you eat lean meat, poultry, or fish. Trans fats are also called "partially hydrogenated oils." They are oils that have been scientifically manipulated so that they are solid at room temperature resulting in a longer shelf life and improved taste and texture of foods in which they are added. Trans fats are found in stick margarine, some tub margarines, cookies, crackers, and baked goods.  When baking and cooking, oils are a great substitute for butter. The monounsaturated oils are  especially beneficial since it is believed they lower LDL and raise HDL. The oils you should avoid entirely are saturated tropical oils, such as coconut and palm.  Remember to eat a lot from food groups that are naturally free of saturated and trans fat, including fish, fruit, vegetables, beans, grains (barley, rice, couscous, bulgur wheat), and pasta (without cream sauces).  IDENTIFYING FOODS THAT LOWER FAT AND CHOLESTEROL  Soluble fiber may lower your cholesterol. This type of fiber is found in fruits such as apples, vegetables such as broccoli, potatoes, and carrots, legumes such as beans, peas, and lentils, and grains such as barley. Foods fortified with plant sterols (phytosterol) may also lower cholesterol. You should eat at least 2 g per day of these foods for a cholesterol lowering effect.  Read package labels to identify low-saturated fats, trans fat free, and low-fat foods at the supermarket. Select cheeses that have only 2 to 3 g saturated fat per ounce. Use a heart-healthy tub margarine that is free of trans fats or partially hydrogenated oil. When buying baked goods (cookies, crackers), avoid partially hydrogenated oils. Breads and muffins should be made from whole grains (whole-wheat or whole oat flour, instead of "flour" or "enriched flour"). Buy non-creamy canned soups with reduced salt and no added fats.  FOOD PREPARATION TECHNIQUES  Never deep-fry. If you must fry, either stir-fry, which uses very little fat, or use non-stick cooking sprays. When possible, broil, bake, or roast meats, and steam vegetables. Instead of putting butter or margarine on vegetables, use lemon and herbs, applesauce, and cinnamon (for squash and sweet potatoes). Use nonfat   yogurt, salsa, and low-fat dressings for salads.  LOW-SATURATED FAT / LOW-FAT FOOD SUBSTITUTES Meats / Saturated Fat (g)  Avoid: Steak, marbled (3 oz/85 g) / 11 g  Choose: Steak, lean (3 oz/85 g) / 4 g  Avoid: Hamburger (3 oz/85 g) / 7  g  Choose: Hamburger, lean (3 oz/85 g) / 5 g  Avoid: Ham (3 oz/85 g) / 6 g  Choose: Ham, lean cut (3 oz/85 g) / 2.4 g  Avoid: Chicken, with skin, dark meat (3 oz/85 g) / 4 g  Choose: Chicken, skin removed, dark meat (3 oz/85 g) / 2 g  Avoid: Chicken, with skin, light meat (3 oz/85 g) / 2.5 g  Choose: Chicken, skin removed, light meat (3 oz/85 g) / 1 g Dairy / Saturated Fat (g)  Avoid: Whole milk (1 cup) / 5 g  Choose: Low-fat milk, 2% (1 cup) / 3 g  Choose: Low-fat milk, 1% (1 cup) / 1.5 g  Choose: Skim milk (1 cup) / 0.3 g  Avoid: Hard cheese (1 oz/28 g) / 6 g  Choose: Skim milk cheese (1 oz/28 g) / 2 to 3 g  Avoid: Cottage cheese, 4% fat (1 cup) / 6.5 g  Choose: Low-fat cottage cheese, 1% fat (1 cup) / 1.5 g  Avoid: Ice cream (1 cup) / 9 g  Choose: Sherbet (1 cup) / 2.5 g  Choose: Nonfat frozen yogurt (1 cup) / 0.3 g  Choose: Frozen fruit bar / trace  Avoid: Whipped cream (1 tbs) / 3.5 g  Choose: Nondairy whipped topping (1 tbs) / 1 g Condiments / Saturated Fat (g)  Avoid: Mayonnaise (1 tbs) / 2 g  Choose: Low-fat mayonnaise (1 tbs) / 1 g  Avoid: Butter (1 tbs) / 7 g  Choose: Extra light margarine (1 tbs) / 1 g  Avoid: Coconut oil (1 tbs) / 11.8 g  Choose: Olive oil (1 tbs) / 1.8 g  Choose: Corn oil (1 tbs) / 1.7 g  Choose: Safflower oil (1 tbs) / 1.2 g  Choose: Sunflower oil (1 tbs) / 1.4 g  Choose: Soybean oil (1 tbs) / 2.4 g  Choose: Canola oil (1 tbs) / 1 g Document Released: 12/10/2005 Document Revised: 04/06/2013 Document Reviewed: 03/10/2014 Exit[employer] Patient Information 2015 [employer]. This information is not intended to replace advice given to you by your health care provider. Make sure you discuss any questions you have with your health care provider.  

## 2015-04-22 LAB — LIPID PANEL
CHOLESTEROL: 140 mg/dL (ref 0–200)
HDL: 51.5 mg/dL (ref >39.00)
LDL CALC: 56 mg/dL (ref 0–99)
NonHDL: 88.5
Total CHOL/HDL Ratio: 3
Triglycerides: 164 mg/dL — ABNORMAL HIGH (ref 0.0–149.0)
VLDL: 32.8 mg/dL (ref 0.0–40.0)

## 2015-04-22 LAB — COMPREHENSIVE METABOLIC PANEL
ALK PHOS: 38 U/L — AB (ref 39–117)
ALT: 16 U/L (ref 0–35)
AST: 18 U/L (ref 0–37)
Albumin: 4.1 g/dL (ref 3.5–5.2)
BUN: 13 mg/dL (ref 6–23)
CALCIUM: 9.5 mg/dL (ref 8.4–10.5)
CO2: 25 meq/L (ref 19–32)
CREATININE: 0.75 mg/dL (ref 0.40–1.20)
Chloride: 103 mEq/L (ref 96–112)
GFR: 92.33 mL/min (ref >60.00)
GLUCOSE: 75 mg/dL (ref 70–99)
POTASSIUM: 3.8 meq/L (ref 3.5–5.1)
SODIUM: 137 meq/L (ref 135–145)
TOTAL PROTEIN: 7.2 g/dL (ref 6.0–8.3)
Total Bilirubin: 0.5 mg/dL (ref 0.2–1.2)

## 2015-05-06 ENCOUNTER — Telehealth: Payer: Self-pay | Admitting: Internal Medicine

## 2015-05-06 NOTE — Telephone Encounter (Signed)
Received fax from [facility]- they state they have no record of patient with that name or DOB

## 2015-08-25 ENCOUNTER — Encounter (INDEPENDENT_AMBULATORY_CARE_PROVIDER_SITE_OTHER): Payer: Self-pay

## 2015-08-25 ENCOUNTER — Encounter: Payer: Self-pay | Admitting: Family Medicine

## 2015-08-25 ENCOUNTER — Ambulatory Visit (INDEPENDENT_AMBULATORY_CARE_PROVIDER_SITE_OTHER): Payer: BC Managed Care – PPO | Admitting: Family Medicine

## 2015-08-25 VITALS — BP 110/76 | HR 65 | Temp 99.1°F | Ht 67.0 in | Wt 178.0 lb

## 2015-08-25 DIAGNOSIS — L237 Allergic contact dermatitis due to plants, except food: Secondary | ICD-10-CM

## 2015-08-25 MED ORDER — PREDNISONE 10 MG PO TABS: ORAL_TABLET | ORAL | Status: DC

## 2015-08-25 NOTE — Progress Notes (Signed)
Pre visit review using our clinic review tool, if applicable. No additional management support is needed unless otherwise documented below in the visit note. 

## 2015-08-25 NOTE — Progress Notes (Signed)
Subjective:  Patient ID: Lindsay Sharp, female    DOB: 1976/03/12  Age: 39 y.o. MRN: [id number]  CC: Rash, ? Poison oak/ivy  HPI:  39 year old female presents to clinic today with complaints of rash.  She reports that she went camping over the weekend and developed rash on Tuesday. The rash is located above her eye, left cheek, and left ear. She reports associated itching and some redness. She been using over-the-counter creams with some improvement in her itching. No associated fever, chills, nausea, vomiting. She reports the rash seemed to be exacerbated when she scratches it. She feels that that is secondary to poison oak or poison ivy especially in the setting of a recent camping trip.  Social Hx  Social History  Substance Use Topics  . Smoking status: Former Smoker -- 1.00 packs/day for 10 years    Types: Cigarettes    Quit date: 08/27/2004  . Smokeless tobacco: Never Used  . Alcohol Use: 0.0 oz/week    0 Standard drinks or equivalent per week     Comment: 1-2 glasses/week   Review of Systems  Constitutional: Negative for fever and chills.  Gastrointestinal: Negative for nausea and vomiting.  Skin: Positive for rash.    Objective:  BP 110/76 mmHg  Pulse 65  Temp(Src) 99.1 F (37.3 C) (Oral)  Ht  (1.702 m)  Wt 178 lb (80.74 kg)  BMI 27.87 kg/m2  SpO2 97%  LMP 08/11/2015  BP/Weight 08/25/2015 04/21/2015 08/27/2014  Systolic BP 110 116 124  Diastolic BP 76 78 84  Wt. (Lbs) 178 173 176  BMI 27.87 27.09 26.77   Physical Exam  Constitutional: She is oriented to person, place, and time. She appears well-developed and well-nourished. No distress.  Cardiovascular: Normal rate and regular rhythm.   No murmur heard. Pulmonary/Chest: Effort normal and breath sounds normal. No respiratory distress. She has no wheezes. She has no rales.  Neurological: She is alert and oriented to person, place, and time.  Skin:  Erythematous papular rash noted above left eye, left cheek  and on left ear.  Left ear with some drainage and crusting.  Psychiatric: She has a normal mood and affect.   Lab Results  Component Value Date   WBC 8.3 06/17/2008   HGB 10.3 DELTA CHECK NOTED* 06/17/2008   HCT 29.2* 06/17/2008   PLT 155 06/17/2008   GLUCOSE 75 04/21/2015   CHOL 140 04/21/2015   TRIG 164.0* 04/21/2015   HDL 51.50 04/21/2015   LDLCALC 56 04/21/2015   ALT 16 04/21/2015   AST 18 04/21/2015   NA 137 04/21/2015   K 3.8 04/21/2015   CL 103 04/21/2015   CREATININE 0.75 04/21/2015   BUN 13 04/21/2015   CO2 25 04/21/2015    Assessment & Plan:   Problem List Items Addressed This Visit    Poison ivy - Primary    New problem.  Rash consistent with poison oak/ivy. Treating with prednisone taper. Rx given today.        Meds ordered this encounter  Medications  . predniSONE (DELTASONE) 10 MG tablet    Sig: 50 mg (5 tablets) daily x 3 days, then 40 mg (4 tablets) daily x 3 days, then 30 mg (3 tablets) daily x 3 days, then 20 mg (2 tablets) daily x 3 days, then 10 mg (1 tablet) daily x 3 days.    Dispense:  45 tablet    Refill:  0    Follow-up: PRN.    Everlene Other,  DO

## 2015-08-25 NOTE — Patient Instructions (Signed)
Take the prednisone as prescribed. ° °Follow up as needed. ° °Take care ° °Dr. Alani Sabbagh  °

## 2015-08-25 NOTE — Assessment & Plan Note (Signed)
New problem.  Rash consistent with poison oak/ivy. Treating with prednisone taper. Rx given today.

## 2016-02-10 ENCOUNTER — Ambulatory Visit (INDEPENDENT_AMBULATORY_CARE_PROVIDER_SITE_OTHER): Payer: BC Managed Care – PPO | Admitting: Family Medicine

## 2016-02-10 ENCOUNTER — Encounter: Payer: Self-pay | Admitting: Family Medicine

## 2016-02-10 VITALS — BP 120/88 | HR 60 | Temp 99.0°F | Ht 67.0 in | Wt 177.4 lb

## 2016-02-10 DIAGNOSIS — J988 Other specified respiratory disorders: Secondary | ICD-10-CM | POA: Insufficient documentation

## 2016-02-10 LAB — POCT INFLUENZA A/B
Influenza A, POC: NEGATIVE
Influenza B, POC: NEGATIVE

## 2016-02-10 MED ORDER — DOXYCYCLINE HYCLATE 100 MG PO TABS: 100.0000 mg | ORAL_TABLET | Freq: Two times a day (BID) | ORAL | Status: DC

## 2016-02-10 NOTE — Patient Instructions (Signed)
Your exam was negative.  This is likely viral.  IF you fail to improve, fill the antibiotic (Doxycycline).  Take care  Dr. Adriana Simas

## 2016-02-10 NOTE — Progress Notes (Signed)
Pre visit review using our clinic review tool, if applicable. No additional management support is needed unless otherwise documented below in the visit note. 

## 2016-02-10 NOTE — Progress Notes (Signed)
Subjective:  Patient ID: Lindsay Sharp, female    DOB: [birth year]  Age: 40 y.o. MRN: [id number]  CC: Fever, cough, sore throat  HPI:  40 year old female presents to clinic today with the above complaints.  Patient states that she's been sick since Saturday. She's had on and off fever, Tmax 101.7. She's also had congestion, cough, sore throat, drainage. No known exacerbating factors. No associated shortness of breath. No interventions tried. No other complaints today.  Social Hx   Social History   Social History  . Marital Status: Married    Spouse Name: Channing Mutters  . Number of Children: 2  . Years of Education: N/A   Social History Main Topics  . Smoking status: Former Smoker -- 1.00 packs/day for 10 years    Types: Cigarettes    Quit date: 08/27/2004  . Smokeless tobacco: Never Used  . Alcohol Use: 0.0 oz/week    0 Standard drinks or equivalent per week     Comment: 1-2 glasses/week  . Drug Use: No  . Sexual Activity: Yes   Other Topics Concern  . None   Social History Narrative   Review of Systems  Constitutional: Positive for fever.  HENT: Positive for congestion, postnasal drip and sore throat.   Respiratory: Positive for cough.    Objective:  BP 120/88 mmHg  Pulse 60  Temp(Src) 99 F (37.2 C) (Oral)  Ht  (1.702 m)  Wt 177 lb 6 oz (80.457 kg)  BMI 27.77 kg/m2  SpO2 98%  BP/Weight 02/10/2016 08/25/2015 04/21/2015  Systolic BP 120 110 116  Diastolic BP 88 76 78  Wt. (Lbs) 177.38 178 173  BMI 27.77 27.87 27.09   Physical Exam  Constitutional: She is oriented to person, place, and time. She appears well-developed. No distress.  HENT:  Head: Normocephalic and atraumatic.  Oropharynx with mild erythema. Normal TM's bilaterally.   Eyes: Conjunctivae are normal.  Neck: Neck supple.  Cardiovascular: Normal rate and regular rhythm.   Pulmonary/Chest: Effort normal and breath sounds normal. No respiratory distress. She has no wheezes. She has no rales.    Lymphadenopathy:    She has no cervical adenopathy.  Neurological: She is alert and oriented to person, place, and time.  Vitals reviewed.  Lab Results  Component Value Date   WBC 8.3 06/17/2008   HGB 10.3 DELTA CHECK NOTED* 06/17/2008   HCT 29.2* 06/17/2008   PLT 155 06/17/2008   GLUCOSE 75 04/21/2015   CHOL 140 04/21/2015   TRIG 164.0* 04/21/2015   HDL 51.50 04/21/2015   LDLCALC 56 04/21/2015   ALT 16 04/21/2015   AST 18 04/21/2015   NA 137 04/21/2015   K 3.8 04/21/2015   CL 103 04/21/2015   CREATININE 0.75 04/21/2015   BUN 13 04/21/2015   CO2 25 04/21/2015    Assessment & Plan:   Problem List Items Addressed This Visit    Respiratory infection - Primary    New problem. Exam unremarkable today. Rapid flu negative.  Likely viral in origin.  Rx for doxycycline given if she fails to improve or worsens.      Relevant Orders   POCT Influenza A/B (Completed)      Meds ordered this encounter  Medications  . doxycycline (VIBRA-TABS) 100 MG tablet    Sig: Take 1 tablet (100 mg total) by mouth 2 (two) times daily.    Dispense:  14 tablet    Refill:  0   Follow-up: PRN  Everlene Other DO [facility]  [place]

## 2016-02-10 NOTE — Assessment & Plan Note (Signed)
New problem. Exam unremarkable today. Rapid flu negative.  Likely viral in origin.  Rx for doxycycline given if she fails to improve or worsens.

## 2016-04-30 ENCOUNTER — Other Ambulatory Visit: Payer: Self-pay | Admitting: Internal Medicine

## 2016-06-22 ENCOUNTER — Encounter: Payer: Self-pay | Admitting: Internal Medicine

## 2016-06-22 ENCOUNTER — Ambulatory Visit (INDEPENDENT_AMBULATORY_CARE_PROVIDER_SITE_OTHER): Payer: BC Managed Care – PPO | Admitting: Internal Medicine

## 2016-06-22 VITALS — BP 114/68 | HR 55 | Temp 99.0°F | Wt 162.0 lb

## 2016-06-22 DIAGNOSIS — M79672 Pain in left foot: Secondary | ICD-10-CM | POA: Diagnosis not present

## 2016-06-22 DIAGNOSIS — E785 Hyperlipidemia, unspecified: Secondary | ICD-10-CM | POA: Diagnosis not present

## 2016-06-22 DIAGNOSIS — Z23 Encounter for immunization: Secondary | ICD-10-CM | POA: Diagnosis not present

## 2016-06-22 DIAGNOSIS — Z0000 Encounter for general adult medical examination without abnormal findings: Secondary | ICD-10-CM | POA: Diagnosis not present

## 2016-06-22 LAB — CBC
HEMATOCRIT: 37.8 % (ref 36.0–46.0)
Hemoglobin: 12.6 g/dL (ref 12.0–15.0)
MCHC: 33.2 g/dL (ref 30.0–36.0)
MCV: 91.3 fl (ref 78.0–100.0)
PLATELETS: 256 10*3/uL (ref 150.0–400.0)
RBC: 4.15 Mil/uL (ref 3.87–5.11)
RDW: 12.5 % (ref 11.5–15.5)
WBC: 7.9 10*3/uL (ref 4.0–10.5)

## 2016-06-22 LAB — LIPID PANEL
CHOLESTEROL: 112 mg/dL (ref 0–200)
HDL: 48.8 mg/dL (ref >39.00)
LDL Cholesterol: 47 mg/dL (ref 0–99)
NonHDL: 63.36
Total CHOL/HDL Ratio: 2
Triglycerides: 80 mg/dL (ref 0.0–149.0)
VLDL: 16 mg/dL (ref 0.0–40.0)

## 2016-06-22 LAB — COMPREHENSIVE METABOLIC PANEL
ALBUMIN: 4.3 g/dL (ref 3.5–5.2)
ALK PHOS: 30 U/L — AB (ref 39–117)
ALT: 13 U/L (ref 0–35)
AST: 14 U/L (ref 0–37)
BUN: 12 mg/dL (ref 6–23)
CALCIUM: 9.4 mg/dL (ref 8.4–10.5)
CO2: 27 mEq/L (ref 19–32)
CREATININE: 0.83 mg/dL (ref 0.40–1.20)
Chloride: 105 mEq/L (ref 96–112)
GFR: 81.62 mL/min (ref >60.00)
Glucose, Bld: 82 mg/dL (ref 70–99)
Potassium: 4.3 mEq/L (ref 3.5–5.1)
Sodium: 137 mEq/L (ref 135–145)
Total Bilirubin: 0.9 mg/dL (ref 0.2–1.2)
Total Protein: 7.2 g/dL (ref 6.0–8.3)

## 2016-06-22 NOTE — Progress Notes (Signed)
Pre visit review using our clinic review tool, if applicable. No additional management support is needed unless otherwise documented below in the visit note. 

## 2016-06-22 NOTE — Progress Notes (Signed)
   Subjective:    Patient ID: Lindsay Sharp, female    DOB: 03/06/1976, 40 y.o.   MRN: [id number]  HPI  erroneous encounter  Review of Systems       Objective:   Physical Exam        Assessment & Plan:

## 2016-06-22 NOTE — Addendum Note (Signed)
Addended by: Lorre MunroeBAITY, REGINA W on: 06/22/2016 02:12 PM   Modules accepted: Kipp BroodSmartSet

## 2016-06-22 NOTE — Progress Notes (Addendum)
Subjective:    Patient ID: Lindsay Sharp, female    DOB: [birth year], 40 y.o.   MRN: [id number]  HPI  Pt presents to the clinic today for her annual exam.  Seh does c/o pain in her left heel. She first noticed this about a month ago. The pain is sharp and stabbing. It is worse first thing in the morning and after running. She has not tried anything OTC for this.  Flu: 10/2015 Tetanus: > 10 years ago Pap Smear: 04/2016 HIV testing: 2005 Mammogram: 2014, resume at age 36 Dentist: biannually  Diet: She does eat meat. She consumes fruits and veggies daily. She tries to avoid fried foods. She drinks mostly water. Exercise: She is walking 5 days per week.  Review of Systems      Past Medical History  Diagnosis Date  . Hyperlipidemia   . Chest pain   . Family history of ovarian cancer   . Family history of breast cancer   . Endometriosis   . Chicken pox     Current Outpatient Prescriptions  Medication Sig Dispense Refill  . aspirin 81 MG tablet Take 81 mg by mouth daily. Reported on 02/10/2016    . atorvastatin (LIPITOR) 40 MG tablet Take 1 tablet (40 mg total) by mouth daily at 6 PM. MUST SCHEDULE ANNUAL EXAM FOR FURTHER REFILLS 90 tablet 0  . Coenzyme Q10 (CO Q-10) 200 MG CAPS Take 200 mg by mouth daily.    . norethindrone-ethinyl estradiol (JUNEL FE,GILDESS FE,LOESTRIN FE) 1-20 MG-MCG tablet Take 1 tablet by mouth daily.    . Omega-3 Fatty Acids (FISH OIL) 1000 MG CAPS Take 2,000 mg by mouth daily.    . polyethylene glycol (MIRALAX / GLYCOLAX) packet Take 17 g by mouth daily.     No current facility-administered medications for this visit.    Allergies  Allergen Reactions  . Erythromycin Nausea And Vomiting  . Penicillins Hives    Family History  Problem Relation Age of Onset  . Hyperlipidemia Father   . Melanoma Father     dx in his 45s  . Hyperlipidemia Paternal Grandfather   . Breast cancer Paternal Aunt     dbl mastectomy; dx in her 74s  . Brain cancer  Maternal Grandmother     dx in her 11s  . Prostate cancer Maternal Grandfather 55  . Ovarian cancer Paternal Grandmother     dx in her 45s    Social History   Social History  . Marital Status: Married    Spouse Name: Channing Mutters  . Number of Children: 2  . Years of Education: N/A   Occupational History  . Not on file.   Social History Main Topics  . Smoking status: Former Smoker -- 1.00 packs/day for 10 years    Types: Cigarettes    Quit date: 08/27/2004  . Smokeless tobacco: Never Used  . Alcohol Use: 0.0 oz/week    0 Standard drinks or equivalent per week     Comment: 1-2 glasses/week  . Drug Use: No  . Sexual Activity: Yes   Other Topics Concern  . Not on file   Social History Narrative     Constitutional: Denies fever, malaise, fatigue, headache or abrupt weight changes.  HEENT: Denies eye pain, eye redness, ear pain, ringing in the ears, wax buildup, runny nose, nasal congestion, bloody nose, or sore throat. Respiratory: Denies difficulty breathing, shortness of breath, cough or sputum production.   Cardiovascular: Denies chest pain, chest tightness, palpitations or  swelling in the hands or feet.  Gastrointestinal: Pt reports occasional reflux and constipation. Denies abdominal pain, bloating, diarrhea or blood in the stool.  GU: Denies urgency, frequency, pain with urination, burning sensation, blood in urine, odor or discharge. Musculoskeletal: Denies decrease in range of motion, difficulty with gait, muscle pain or joint pain and swelling.  Skin: Denies redness, rashes, lesions or ulcercations.  Neurological: Denies dizziness, difficulty with memory, difficulty with speech or problems with balance and coordination.  Psych: Denies anxiety, depression, SI/HI.  No other specific complaints in a complete review of systems (except as listed in HPI above).  Objective:   Physical Exam  BP 114/68 mmHg  Pulse 55  Temp(Src) 99 F (37.2 C) (Oral)  Wt 162 lb (73.483 kg)   SpO2 98%  LMP 02/23/2016 Wt Readings from Last 3 Encounters:  06/22/16 162 lb (73.483 kg)  06/22/16 162 lb 12 oz (73.823 kg)  02/10/16 177 lb 6 oz (80.457 kg)    General: Appears her stated age, well developed, well nourished in NAD. Skin: Warm, dry and intact. HEENT: Head: normal shape and size; Eyes: sclera white, no icterus, conjunctiva pink, PERRLA and EOMs intact; Ears: Tm's gray and intact, normal light reflex;Throat/Mouth: Teeth present, mucosa pink and moist, no exudate, lesions or ulcerations noted.  Neck:  Neck supple, trachea midline. No masses, lumps or thyromegaly present.  Cardiovascular: Normal rate and rhythm. S1,S2 noted.  No murmur, rubs or gallops noted. No JVD or BLE edema. Pulmonary/Chest: Normal effort and positive vesicular breath sounds. No respiratory distress. No wheezes, rales or ronchi noted.  Abdomen: Soft and nontender. Normal bowel sounds. No distention or masses noted. Liver, spleen and kidneys non palpable. Musculoskeletal: Strength 5/5 BUE/BLE. No signs of joint swelling. No difficulty with gait. No pain with palpation of the left heel, arch or ball of foot. Neurological: Alert and oriented. Cranial nerves II-XII grossly intact. Coordination normal.  Psychiatric: Mood and affect normal. Behavior is normal. Judgment and thought content normal.    BMET    Component Value Date/Time   NA 137 04/21/2015 1533   K 3.8 04/21/2015 1533   CL 103 04/21/2015 1533   CO2 25 04/21/2015 1533   GLUCOSE 75 04/21/2015 1533   BUN 13 04/21/2015 1533   CREATININE 0.75 04/21/2015 1533   CALCIUM 9.5 04/21/2015 1533    Lipid Panel     Component Value Date/Time   CHOL 140 04/21/2015 1533   TRIG 164.0* 04/21/2015 1533   HDL 51.50 04/21/2015 1533   CHOLHDL 3 04/21/2015 1533   VLDL 32.8 04/21/2015 1533   LDLCALC 56 04/21/2015 1533    CBC    Component Value Date/Time   WBC 8.3 06/17/2008 0510   RBC 3.14* 06/17/2008 0510   HGB 10.3 DELTA CHECK NOTED* 06/17/2008  0510   HCT 29.2* 06/17/2008 0510   PLT 155 06/17/2008 0510   MCV 93.2 06/17/2008 0510   MCHC 35.3 06/17/2008 0510   RDW 12.7 06/17/2008 0510   LYMPHSABS 3.3 01/07/2008 2035   MONOABS 0.6 01/07/2008 2035   EOSABS 0.1 01/07/2008 2035   BASOSABS 0.0 01/07/2008 2035    Hgb A1C No results found for: HGBA1C        Assessment & Plan:  Preventative Health Maintenance:  Encouraged her to get a flu shot in the fall Tetanus booster today Pap Smear due 2020 Discussed resuming mammograms at age 40 Encouraged her to consume a balanced diet and exercise regimen Encouraged her to see a dentist annually Will check  CBC, CMET and Lipid Profile today  Left heel pain:  Likely bone spur Discussed taking Aleve once daily Plantar fasciitis rehab given If persist, make and appt with Dr. Patsy Lager[place]land  RTC in 1 year for annual exam/follow up appt

## 2016-06-22 NOTE — Patient Instructions (Signed)
Health Maintenance, Female Adopting a healthy lifestyle and getting preventive care can go a long way to promote health and wellness. Talk with your health care provider about what schedule of regular examinations is right for you. This is a good chance for you to check in with your provider about disease prevention and staying healthy. In between checkups, there are plenty of things you can do on your own. Experts have done a lot of research about which lifestyle changes and preventive measures are most likely to keep you healthy. Ask your health care provider for more information. WEIGHT AND DIET  Eat a healthy diet  Be sure to include plenty of vegetables, fruits, low-fat dairy products, and lean protein.  Do not eat a lot of foods high in solid fats, added sugars, or salt.  Get regular exercise. This is one of the most important things you can do for your health.  Most adults should exercise for at least 150 minutes each week. The exercise should increase your heart rate and make you sweat (moderate-intensity exercise).  Most adults should also do strengthening exercises at least twice a week. This is in addition to the moderate-intensity exercise.  Maintain a healthy weight  Body mass index (BMI) is a measurement that can be used to identify possible weight problems. It estimates body fat based on height and weight. Your health care provider can help determine your BMI and help you achieve or maintain a healthy weight.  For females 20 years of age and older:   A BMI below 18.5 is considered underweight.  A BMI of 18.5 to 24.9 is normal.  A BMI of 25 to 29.9 is considered overweight.  A BMI of 30 and above is considered obese.  Watch levels of cholesterol and blood lipids  You should start having your blood tested for lipids and cholesterol at 38 years of age, then have this test every 5 years.  You may need to have your cholesterol levels checked more often if:  Your lipid  or cholesterol levels are high.  You are older than 38 years of age.  You are at high risk for heart disease.  CANCER SCREENING   Lung Cancer  Lung cancer screening is recommended for adults 55-80 years old who are at high risk for lung cancer because of a history of smoking.  A yearly low-dose CT scan of the lungs is recommended for people who:  Currently smoke.  Have quit within the past 15 years.  Have at least a 30-pack-year history of smoking. A pack year is smoking an average of one pack of cigarettes a day for 1 year.  Yearly screening should continue until it has been 15 years since you quit.  Yearly screening should stop if you develop a health problem that would prevent you from having lung cancer treatment.  Breast Cancer  Practice breast self-awareness. This means understanding how your breasts normally appear and feel.  It also means doing regular breast self-exams. Let your health care provider know about any changes, no matter how small.  If you are in your 20s or 30s, you should have a clinical breast exam (CBE) by a health care provider every 1-3 years as part of a regular health exam.  If you are 40 or older, have a CBE every year. Also consider having a breast X-ray (mammogram) every year.  If you have a family history of breast cancer, talk to your health care provider about genetic screening.  If you   are at high risk for breast cancer, talk to your health care provider about having an MRI and a mammogram every year.  Breast cancer gene (BRCA) assessment is recommended for women who have family members with BRCA-related cancers. BRCA-related cancers include:  Breast.  Ovarian.  Tubal.  Peritoneal cancers.  Results of the assessment will determine the need for genetic counseling and BRCA1 and BRCA2 testing. Cervical Cancer Your health care provider may recommend that you be screened regularly for cancer of the pelvic organs (ovaries, uterus, and  vagina). This screening involves a pelvic examination, including checking for microscopic changes to the surface of your cervix (Pap test). You may be encouraged to have this screening done every 3 years, beginning at age 21.  For women ages 30-65, health care providers may recommend pelvic exams and Pap testing every 3 years, or they may recommend the Pap and pelvic exam, combined with testing for human papilloma virus (HPV), every 5 years. Some types of HPV increase your risk of cervical cancer. Testing for HPV may also be done on women of any age with unclear Pap test results.  Other health care providers may not recommend any screening for nonpregnant women who are considered low risk for pelvic cancer and who do not have symptoms. Ask your health care provider if a screening pelvic exam is right for you.  If you have had past treatment for cervical cancer or a condition that could lead to cancer, you need Pap tests and screening for cancer for at least 20 years after your treatment. If Pap tests have been discontinued, your risk factors (such as having a new sexual partner) need to be reassessed to determine if screening should resume. Some women have medical problems that increase the chance of getting cervical cancer. In these cases, your health care provider may recommend more frequent screening and Pap tests. Colorectal Cancer  This type of cancer can be detected and often prevented.  Routine colorectal cancer screening usually begins at 38 years of age and continues through 38 years of age.  Your health care provider may recommend screening at an earlier age if you have risk factors for colon cancer.  Your health care provider may also recommend using home test kits to check for hidden blood in the stool.  A small camera at the end of a tube can be used to examine your colon directly (sigmoidoscopy or colonoscopy). This is done to check for the earliest forms of colorectal  cancer.  Routine screening usually begins at age 50.  Direct examination of the colon should be repeated every 5-10 years through 38 years of age. However, you may need to be screened more often if early forms of precancerous polyps or small growths are found. Skin Cancer  Check your skin from head to toe regularly.  Tell your health care provider about any new moles or changes in moles, especially if there is a change in a mole's shape or color.  Also tell your health care provider if you have a mole that is larger than the size of a pencil eraser.  Always use sunscreen. Apply sunscreen liberally and repeatedly throughout the day.  Protect yourself by wearing long sleeves, pants, a wide-brimmed hat, and sunglasses whenever you are outside. HEART DISEASE, DIABETES, AND HIGH BLOOD PRESSURE   High blood pressure causes heart disease and increases the risk of stroke. High blood pressure is more likely to develop in:  People who have blood pressure in the high end   of the normal range (130-139/85-89 mm Hg).  People who are overweight or obese.  People who are African American.  If you are 38-23 years of age, have your blood pressure checked every 3-5 years. If you are 61 years of age or older, have your blood pressure checked every year. You should have your blood pressure measured twice--once when you are at a hospital or clinic, and once when you are not at a hospital or clinic. Record the average of the two measurements. To check your blood pressure when you are not at a hospital or clinic, you can use:  An automated blood pressure machine at a pharmacy.  A home blood pressure monitor.  If you are between 45 years and 39 years old, ask your health care provider if you should take aspirin to prevent strokes.  Have regular diabetes screenings. This involves taking a blood sample to check your fasting blood sugar level.  If you are at a normal weight and have a low risk for diabetes,  have this test once every three years after 38 years of age.  If you are overweight and have a high risk for diabetes, consider being tested at a younger age or more often. PREVENTING INFECTION  Hepatitis B  If you have a higher risk for hepatitis B, you should be screened for this virus. You are considered at high risk for hepatitis B if:  You were born in a country where hepatitis B is common. Ask your health care provider which countries are considered high risk.  Your parents were born in a high-risk country, and you have not been immunized against hepatitis B (hepatitis B vaccine).  You have HIV or AIDS.  You use needles to inject street drugs.  You live with someone who has hepatitis B.  You have had sex with someone who has hepatitis B.  You get hemodialysis treatment.  You take certain medicines for conditions, including cancer, organ transplantation, and autoimmune conditions. Hepatitis C  Blood testing is recommended for:  Everyone born from [birth year] through 1963.  Anyone with known risk factors for hepatitis C. Sexually transmitted infections (STIs)  You should be screened for sexually transmitted infections (STIs) including gonorrhea and chlamydia if:  You are sexually active and are younger than 38 years of age.  You are older than 38 years of age and your health care provider tells you that you are at risk for this type of infection.  Your sexual activity has changed since you were last screened and you are at an increased risk for chlamydia or gonorrhea. Ask your health care provider if you are at risk.  If you do not have HIV, but are at risk, it may be recommended that you take a prescription medicine daily to prevent HIV infection. This is called pre-exposure prophylaxis (PrEP). You are considered at risk if:  You are sexually active and do not regularly use condoms or know the HIV status of your partner(s).  You take drugs by injection.  You are sexually  active with a partner who has HIV. Talk with your health care provider about whether you are at high risk of being infected with HIV. If you choose to begin PrEP, you should first be tested for HIV. You should then be tested every 3 months for as long as you are taking PrEP.  PREGNANCY   If you are premenopausal and you may become pregnant, ask your health care provider about preconception counseling.  If you may  become pregnant, take 400 to 800 micrograms (mcg) of folic acid every day.  If you want to prevent pregnancy, talk to your health care provider about birth control (contraception). OSTEOPOROSIS AND MENOPAUSE   Osteoporosis is a disease in which the bones lose minerals and strength with aging. This can result in serious bone fractures. Your risk for osteoporosis can be identified using a bone density scan.  If you are 61 years of age or older, or if you are at risk for osteoporosis and fractures, ask your health care provider if you should be screened.  Ask your health care provider whether you should take a calcium or vitamin D supplement to lower your risk for osteoporosis.  Menopause may have certain physical symptoms and risks.  Hormone replacement therapy may reduce some of these symptoms and risks. Talk to your health care provider about whether hormone replacement therapy is right for you.  HOME CARE INSTRUCTIONS   Schedule regular health, dental, and eye exams.  Stay current with your immunizations.   Do not use any tobacco products including cigarettes, chewing tobacco, or electronic cigarettes.  If you are pregnant, do not drink alcohol.  If you are breastfeeding, limit how much and how often you drink alcohol.  Limit alcohol intake to no more than 1 drink per day for nonpregnant women. One drink equals 12 ounces of beer, 5 ounces of wine, or 1 ounces of hard liquor.  Do not use street drugs.  Do not share needles.  Ask your health care provider for help if  you need support or information about quitting drugs.  Tell your health care provider if you often feel depressed.  Tell your health care provider if you have ever been abused or do not feel safe at home.   This information is not intended to replace advice given to you by your health care provider. Make sure you discuss any questions you have with your health care provider.   Document Released: 06/25/2011 Document Revised: 12/31/2014 Document Reviewed: 11/11/2013 Elsevier Interactive Patient Education [employer].

## 2016-06-22 NOTE — Addendum Note (Signed)
Addended by: Roena MaladyEVONTENNO, Donell Sliwinski Y on: 06/22/2016 04:22 PM   Modules accepted: Orders, SmartSet

## 2016-06-28 NOTE — Addendum Note (Signed)
Addended by: Roena MaladyEVONTENNO, MELANIE Y on: 06/28/2016 10:32 AM   Modules accepted: Orders, SmartSet

## 2016-07-29 ENCOUNTER — Other Ambulatory Visit: Payer: Self-pay | Admitting: Internal Medicine

## 2016-07-31 MED ORDER — ATORVASTATIN CALCIUM 20 MG PO TABS: 20.0000 mg | ORAL_TABLET | Freq: Every day | ORAL | 0 refills | Status: DC

## 2016-11-12 ENCOUNTER — Other Ambulatory Visit: Payer: Self-pay | Admitting: Internal Medicine

## 2017-02-08 ENCOUNTER — Other Ambulatory Visit: Payer: Self-pay | Admitting: Internal Medicine

## 2017-02-12 ENCOUNTER — Other Ambulatory Visit (INDEPENDENT_AMBULATORY_CARE_PROVIDER_SITE_OTHER): Payer: BC Managed Care – PPO

## 2017-02-12 ENCOUNTER — Encounter (INDEPENDENT_AMBULATORY_CARE_PROVIDER_SITE_OTHER): Payer: Self-pay

## 2017-02-12 DIAGNOSIS — E785 Hyperlipidemia, unspecified: Secondary | ICD-10-CM | POA: Diagnosis not present

## 2017-02-12 DIAGNOSIS — E7849 Other hyperlipidemia: Secondary | ICD-10-CM

## 2017-02-12 LAB — COMPREHENSIVE METABOLIC PANEL
ALBUMIN: 4.2 g/dL (ref 3.5–5.2)
ALK PHOS: 30 U/L — AB (ref 39–117)
ALT: 16 U/L (ref 0–35)
AST: 15 U/L (ref 0–37)
BUN: 14 mg/dL (ref 6–23)
CALCIUM: 9.6 mg/dL (ref 8.4–10.5)
CHLORIDE: 106 meq/L (ref 96–112)
CO2: 27 mEq/L (ref 19–32)
Creatinine, Ser: 0.96 mg/dL (ref 0.40–1.20)
GFR: 68.77 mL/min (ref >60.00)
Glucose, Bld: 88 mg/dL (ref 70–99)
POTASSIUM: 4.6 meq/L (ref 3.5–5.1)
SODIUM: 138 meq/L (ref 135–145)
TOTAL PROTEIN: 7.1 g/dL (ref 6.0–8.3)
Total Bilirubin: 0.5 mg/dL (ref 0.2–1.2)

## 2017-02-12 LAB — LIPID PANEL
CHOLESTEROL: 138 mg/dL (ref 0–200)
HDL: 52.8 mg/dL (ref >39.00)
LDL CALC: 69 mg/dL (ref 0–99)
NonHDL: 84.86
Total CHOL/HDL Ratio: 3
Triglycerides: 77 mg/dL (ref 0.0–149.0)
VLDL: 15.4 mg/dL (ref 0.0–40.0)

## 2017-02-13 MED ORDER — ATORVASTATIN CALCIUM 20 MG PO TABS: 20.0000 mg | ORAL_TABLET | Freq: Every day | ORAL | 0 refills | Status: DC

## 2017-02-13 NOTE — Addendum Note (Signed)
Addended by: Roena MaladyEVONTENNO, Preslei Blakley Y on: 02/13/2017 04:54 PM   Modules accepted: Orders

## 2017-02-15 ENCOUNTER — Other Ambulatory Visit: Payer: BC Managed Care – PPO

## 2017-02-25 ENCOUNTER — Ambulatory Visit (INDEPENDENT_AMBULATORY_CARE_PROVIDER_SITE_OTHER): Payer: BC Managed Care – PPO | Admitting: Internal Medicine

## 2017-02-25 ENCOUNTER — Encounter: Payer: Self-pay | Admitting: Internal Medicine

## 2017-02-25 VITALS — BP 116/68 | HR 64 | Temp 98.8°F | Wt 161.5 lb

## 2017-02-25 DIAGNOSIS — R51 Headache: Secondary | ICD-10-CM | POA: Diagnosis not present

## 2017-02-25 DIAGNOSIS — J029 Acute pharyngitis, unspecified: Secondary | ICD-10-CM | POA: Diagnosis not present

## 2017-02-25 DIAGNOSIS — R6883 Chills (without fever): Secondary | ICD-10-CM

## 2017-02-25 DIAGNOSIS — R519 Headache, unspecified: Secondary | ICD-10-CM

## 2017-02-25 DIAGNOSIS — J309 Allergic rhinitis, unspecified: Secondary | ICD-10-CM

## 2017-02-25 NOTE — Progress Notes (Signed)
HPI  Pt presents to the clinic today with c/o headache, runny nose and sore throat. This started yesterday. She describes the headache as pressure. She is blowing clear mucous out of her nose. She denies difficulty swallowing. She denies ear pain, nasal congestion, cough. She denies fever or body aches, but has had chills. She has not taken anything OTC. She has had sick contacts diagnosed with the flu. She did not get her flu shot.  Review of Systems     Past Medical History:  Diagnosis Date  . Chest pain   . Chicken pox   . Endometriosis   . Family history of breast cancer   . Family history of ovarian cancer   . Hyperlipidemia     Family History  Problem Relation Age of Onset  . Hyperlipidemia Father   . Melanoma Father     dx in his 2750s  . Hyperlipidemia Paternal Grandfather   . Breast cancer Paternal Aunt     dbl mastectomy; dx in her 6450s  . Brain cancer Maternal Grandmother     dx in her 7460s  . Prostate cancer Maternal Grandfather [id number]  . Ovarian cancer Paternal Grandmother     dx in her 7960s    Social History   Social History  . Marital status: Married    Spouse name: Channing MuttersRoy  . Number of children: 2  . Years of education: N/A   Occupational History  . Not on file.   Social History Main Topics  . Smoking status: Former Smoker    Packs/day: 1.00    Years: 10.00    Types: Cigarettes    Quit date: 08/27/2004  . Smokeless tobacco: Never Used  . Alcohol use 0.0 oz/week     Comment: 1-2 glasses/week  . Drug use: No  . Sexual activity: Yes   Other Topics Concern  . Not on file   Social History Narrative  . No narrative on file    Allergies  Allergen Reactions  . Erythromycin Nausea And Vomiting  . Penicillins Hives     Constitutional: Positive headache, fatigue. Denies fever or abrupt weight changes.  HEENT:  Positive runny nose and sore throat. Denies eye redness, ear pain, ringing in the ears, wax buildup, or bloody nose. Respiratory: Denies cough,  difficulty breathing or shortness of breath.  Cardiovascular: Denies chest pain, chest tightness, palpitations or swelling in the hands or feet.   No other specific complaints in a complete review of systems (except as listed in HPI above).  Objective:   BP 116/68   Pulse 64   Temp 98.8 F (37.1 C) (Oral)   Wt 161 lb 8 oz (73.3 kg)   SpO2 99%   BMI 25.29 kg/m   General: Appears her stated age, well developed, well nourished in NAD. HEENT: Head: normal shape and size, no sinus tenderness noted;Ears: Tm's gray and intact, normal light reflex; Nose: mucosa boggy and moist, septum midline; Throat/Mouth: + PND. Teeth present, mucosa erythematous and moist, no exudate noted, no lesions or ulcerations noted.  Neck:  No adenopathy noted.  Cardiovascular: Normal rate and rhythm. S1,S2 noted.  No murmur, rubs or gallops noted.  Pulmonary/Chest: Normal effort and positive vesicular breath sounds. No respiratory distress. No wheezes, rales or ronchi noted.       Assessment & Plan:   Allergic Rhinitis:  She is requesting flu test today-negative Start Zyrtec OTC Flonase 2 sprays each nostril for 3 days and then as needed.   RTC as needed  or if symptoms persist. Webb Silversmith, NP

## 2017-02-25 NOTE — Patient Instructions (Signed)
Allergic Rhinitis Allergic rhinitis is when the mucous membranes in the nose respond to allergens. Allergens are particles in the air that cause your body to have an allergic reaction. This causes you to release allergic antibodies. Through a chain of events, these eventually cause you to release histamine into the blood stream. Although meant to protect the body, it is this release of histamine that causes your discomfort, such as frequent sneezing, congestion, and an itchy, runny nose. What are the causes? Seasonal allergic rhinitis (hay fever) is caused by pollen allergens that may come from grasses, trees, and weeds. Year-round allergic rhinitis (perennial allergic rhinitis) is caused by allergens such as house dust mites, pet dander, and mold spores. What are the signs or symptoms?  Nasal stuffiness (congestion).  Itchy, runny nose with sneezing and tearing of the eyes. How is this diagnosed? Your health care provider can help you determine the allergen or allergens that trigger your symptoms. If you and your health care provider are unable to determine the allergen, skin or blood testing may be used. Your health care provider will diagnose your condition after taking your health history and performing a physical exam. Your health care provider may assess you for other related conditions, such as asthma, pink eye, or an ear infection. How is this treated? Allergic rhinitis does not have a cure, but it can be controlled by:  Medicines that block allergy symptoms. These may include allergy shots, nasal sprays, and oral antihistamines.  Avoiding the allergen. Hay fever may often be treated with antihistamines in pill or nasal spray forms. Antihistamines block the effects of histamine. There are over-the-counter medicines that may help with nasal congestion and swelling around the eyes. Check with your health care provider before taking or giving this medicine. If avoiding the allergen or the  medicine prescribed do not work, there are many new medicines your health care provider can prescribe. Stronger medicine may be used if initial measures are ineffective. Desensitizing injections can be used if medicine and avoidance does not work. Desensitization is when a patient is given ongoing shots until the body becomes less sensitive to the allergen. Make sure you follow up with your health care provider if problems continue. Follow these instructions at home: It is not possible to completely avoid allergens, but you can reduce your symptoms by taking steps to limit your exposure to them. It helps to know exactly what you are allergic to so that you can avoid your specific triggers. Contact a health care provider if:  You have a fever.  You develop a cough that does not stop easily (persistent).  You have shortness of breath.  You start wheezing.  Symptoms interfere with normal daily activities. This information is not intended to replace advice given to you by your health care provider. Make sure you discuss any questions you have with your health care provider. Document Released: 09/04/2001 Document Revised: 08/10/2016 Document Reviewed: 08/17/2013 Elsevier Interactive Patient Education  2017 [employer]  

## 2017-04-15 ENCOUNTER — Encounter: Payer: Self-pay | Admitting: Internal Medicine

## 2017-04-23 ENCOUNTER — Ambulatory Visit (INDEPENDENT_AMBULATORY_CARE_PROVIDER_SITE_OTHER): Payer: BC Managed Care – PPO | Admitting: Internal Medicine

## 2017-04-23 ENCOUNTER — Encounter: Payer: Self-pay | Admitting: Internal Medicine

## 2017-04-23 VITALS — BP 114/70 | HR 61 | Temp 98.7°F | Wt 158.8 lb

## 2017-04-23 DIAGNOSIS — H53133 Sudden visual loss, bilateral: Secondary | ICD-10-CM

## 2017-04-23 DIAGNOSIS — R51 Headache: Secondary | ICD-10-CM

## 2017-04-23 DIAGNOSIS — R112 Nausea with vomiting, unspecified: Secondary | ICD-10-CM

## 2017-04-23 DIAGNOSIS — R519 Headache, unspecified: Secondary | ICD-10-CM

## 2017-04-23 NOTE — Progress Notes (Signed)
Subjective:    Patient ID: Lindsay Sharp, female    DOB: 09-Apr-1976, 41 y.o.   MRN: [id number]  HPI  Pt presents to the clinic today with c/o intermittent vision loss. This started 1 year ago. It will come on all of a sudden. The vision loss usually starts in the peripheral vision of her left eye and rotate around until she can't see out of her left eye. The vision loss usually last for just a few minutes then resolves spontaneously. However, she had an episode last week of vision loss that started like normal in her left eye, but then she lost vision in her right eyes as well which had never happened before. This particular instance was also accompanied by a headache in her right temple. She describes the headache as sharp and stabbing. She did have an episode of nausea and vomiting. She took some Ibuprofen which helped the headache, and the vision loss resolved spontaneously. She reports she never sees prisms, flashing lights, or floaters. She is unable to tell me if her vision is just black or she sees light but nothing else. She had an eye exam in December which was normal.  No family history of aneurysm or migraines. Family history of brain tumor in grandmother.   Review of Systems      Past Medical History:  Diagnosis Date  . Chest pain   . Chicken pox   . Endometriosis   . Family history of breast cancer   . Family history of ovarian cancer   . Hyperlipidemia     Current Outpatient Prescriptions  Medication Sig Dispense Refill  . aspirin 81 MG tablet Take 81 mg by mouth daily. Reported on 02/10/2016    . atorvastatin (LIPITOR) 20 MG tablet Take 1 tablet (20 mg total) by mouth daily. 90 tablet 0  . Coenzyme Q10 (CO Q-10) 200 MG CAPS Take 200 mg by mouth daily.    . norethindrone-ethinyl estradiol (JUNEL FE,GILDESS FE,LOESTRIN FE) 1-20 MG-MCG tablet Take 1 tablet by mouth daily.    . Omega-3 Fatty Acids (FISH OIL) 1000 MG CAPS Take 2,000 mg by mouth daily.    . polyethylene  glycol (MIRALAX / GLYCOLAX) packet Take 17 g by mouth daily.     No current facility-administered medications for this visit.     Allergies  Allergen Reactions  . Erythromycin Nausea And Vomiting  . Penicillins Hives    Family History  Problem Relation Age of Onset  . Hyperlipidemia Father   . Melanoma Father     dx in his 9s  . Hyperlipidemia Paternal Grandfather   . Breast cancer Paternal Aunt     dbl mastectomy; dx in her 44s  . Brain cancer Maternal Grandmother     dx in her 84s  . Prostate cancer Maternal Grandfather 42  . Ovarian cancer Paternal Grandmother     dx in her 10s    Social History   Social History  . Marital status: Married    Spouse name: Channing Mutters  . Number of children: 2  . Years of education: N/A   Occupational History  . Not on file.   Social History Main Topics  . Smoking status: Former Smoker    Packs/day: 1.00    Years: 10.00    Types: Cigarettes    Quit date: 08/27/2004  . Smokeless tobacco: Never Used  . Alcohol use 0.0 oz/week     Comment: 1-2 glasses/week  . Drug use: No  . Sexual  activity: Yes   Other Topics Concern  . Not on file   Social History Narrative  . No narrative on file     Constitutional: Pt reports headache. Denies fever, malaise, fatigue, or abrupt weight changes.  HEENT: Pt reports vision loss. Denies eye pain, eye redness, ear pain, ringing in the ears, wax buildup, runny nose, nasal congestion, bloody nose, or sore throat. Gastrointestinal: Pt reports episode of nausea and vomiting. Denies abdominal pain, bloating, constipation, diarrhea or blood in the stool.  Neurological: Denies dizziness, difficulty with memory, difficulty with speech or problems with balance and coordination.    No other specific complaints in a complete review of systems (except as listed in HPI above).  Objective:   Physical Exam   BP 114/70   Pulse 61   Temp 98.7 F (37.1 C) (Oral)   Wt 158 lb 12 oz (72 kg)   LMP  (LMP Unknown)    SpO2 99%   BMI 24.86 kg/m  Wt Readings from Last 3 Encounters:  04/23/17 158 lb 12 oz (72 kg)  02/25/17 161 lb 8 oz (73.3 kg)  06/22/16 162 lb (73.5 kg)    General: Appears her stated age, well developed, well nourished in NAD. HEENT: Head: normal shape and size; Eyes: PERRLA and EOMs intact;  Neurological: Alert and oriented. Coordination normal.    BMET    Component Value Date/Time   NA 138 02/12/2017 0807   K 4.6 02/12/2017 0807   CL 106 02/12/2017 0807   CO2 27 02/12/2017 0807   GLUCOSE 88 02/12/2017 0807   BUN 14 02/12/2017 0807   CREATININE 0.96 02/12/2017 0807   CALCIUM 9.6 02/12/2017 0807    Lipid Panel     Component Value Date/Time   CHOL 138 02/12/2017 0807   TRIG 77.0 02/12/2017 0807   HDL 52.80 02/12/2017 0807   CHOLHDL 3 02/12/2017 0807   VLDL 15.4 02/12/2017 0807   LDLCALC 69 02/12/2017 0807    CBC    Component Value Date/Time   WBC 7.9 06/22/2016 1405   RBC 4.15 06/22/2016 1405   HGB 12.6 06/22/2016 1405   HCT 37.8 06/22/2016 1405   PLT 256.0 06/22/2016 1405   MCV 91.3 06/22/2016 1405   MCHC 33.2 06/22/2016 1405   RDW 12.5 06/22/2016 1405   LYMPHSABS 3.3 01/07/2008 2035   MONOABS 0.6 01/07/2008 2035   EOSABS 0.1 01/07/2008 2035   BASOSABS 0.0 01/07/2008 2035    Hgb A1C No results found for: HGBA1C         Assessment & Plan:   Vision Loss, Intermittent Headache, Episode of Nausea and Vomiting:  Will check CMET, A1C and TSH today MRI brain ordered May need to follow up with neurology depending on labs and MRI results  Return precautions discussed [name], NP

## 2017-04-24 LAB — COMPREHENSIVE METABOLIC PANEL WITH GFR
ALT: 15 U/L (ref 0–35)
AST: 17 U/L (ref 0–37)
Albumin: 4.2 g/dL (ref 3.5–5.2)
Alkaline Phosphatase: 30 U/L — ABNORMAL LOW (ref 39–117)
BUN: 15 mg/dL (ref 6–23)
CO2: 25 meq/L (ref 19–32)
Calcium: 9.6 mg/dL (ref 8.4–10.5)
Chloride: 105 meq/L (ref 96–112)
Creatinine, Ser: 0.88 mg/dL (ref 0.40–1.20)
GFR: 75.96 mL/min
Glucose, Bld: 85 mg/dL (ref 70–99)
Potassium: 4.6 meq/L (ref 3.5–5.1)
Sodium: 136 meq/L (ref 135–145)
Total Bilirubin: 0.4 mg/dL (ref 0.2–1.2)
Total Protein: 7.2 g/dL (ref 6.0–8.3)

## 2017-04-24 LAB — HEMOGLOBIN A1C: HEMOGLOBIN A1C: 5.1 % (ref 4.6–6.5)

## 2017-04-24 LAB — TSH: TSH: 3.54 u[IU]/mL (ref 0.35–4.50)

## 2017-04-25 NOTE — Patient Instructions (Signed)
General Headache Without Cause A headache is pain or discomfort felt around the head or neck area. There are many causes and types of headaches. In some cases, the cause may not be found. Follow these instructions at home: Managing pain   Take over-the-counter and prescription medicines only as told by your doctor.  Lie down in a dark, quiet room when you have a headache.  If directed, apply ice to the head and neck area:  Put ice in a plastic bag.  Place a towel between your skin and the bag.  Leave the ice on for 20 minutes, 2-3 times per day.  Use a heating pad or hot shower to apply heat to the head and neck area as told by your doctor.  Keep lights dim if bright lights bother you or make your headaches worse. Eating and drinking   Eat meals on a regular schedule.  Lessen how much alcohol you drink.  Lessen how much caffeine you drink, or stop drinking caffeine. General instructions   Keep all follow-up visits as told by your doctor. This is important.  Keep a journal to find out if certain things bring on headaches. For example, write down:  What you eat and drink.  How much sleep you get.  Any change to your diet or medicines.  Relax by getting a massage or doing other relaxing activities.  Lessen stress.  Sit up straight. Do not tighten (tense) your muscles.  Do not use tobacco products. This includes cigarettes, chewing tobacco, or e-cigarettes. If you need help quitting, ask your doctor.  Exercise regularly as told by your doctor.  Get enough sleep. This often means 7-9 hours of sleep. Contact a doctor if:  Your symptoms are not helped by medicine.  You have a headache that feels different than the other headaches.  You feel sick to your stomach (nauseous) or you throw up (vomit).  You have a fever. Get help right away if:  Your headache becomes really bad.  You keep throwing up.  You have a stiff neck.  You have trouble seeing.  You have  trouble speaking.  You have pain in the eye or ear.  Your muscles are weak or you lose muscle control.  You lose your balance or have trouble walking.  You feel like you will pass out (faint) or you pass out.  You have confusion. This information is not intended to replace advice given to you by your health care provider. Make sure you discuss any questions you have with your health care provider. Document Released: 09/18/2008 Document Revised: 05/17/2016 Document Reviewed: 04/04/2015 Elsevier Interactive Patient Education  2017 Elsevier Inc.  

## 2017-05-07 ENCOUNTER — Ambulatory Visit
Admission: RE | Admit: 2017-05-07 | Discharge: 2017-05-07 | Disposition: A | Discharge status: Home / Self Care | Payer: BC Managed Care – PPO | Source: Ambulatory Visit | Attending: Internal Medicine | Admitting: Internal Medicine

## 2017-05-07 DIAGNOSIS — H53133 Sudden visual loss, bilateral: Secondary | ICD-10-CM | POA: Diagnosis present

## 2017-05-07 MED ORDER — GADOBENATE DIMEGLUMINE 529 MG/ML IV SOLN
15.0000 mL | Freq: Once | INTRAVENOUS | Status: AC | PRN
  Administered 2017-05-07: 14 mL via INTRAVENOUS

## 2017-05-11 ENCOUNTER — Other Ambulatory Visit: Payer: Self-pay | Admitting: Internal Medicine

## 2017-05-14 ENCOUNTER — Other Ambulatory Visit: Payer: Self-pay | Admitting: Internal Medicine

## 2017-05-14 DIAGNOSIS — H53133 Sudden visual loss, bilateral: Secondary | ICD-10-CM

## 2017-06-05 ENCOUNTER — Encounter: Payer: Self-pay | Admitting: Internal Medicine

## 2017-08-11 ENCOUNTER — Other Ambulatory Visit: Payer: Self-pay | Admitting: Internal Medicine

## 2017-09-17 ENCOUNTER — Other Ambulatory Visit: Payer: Self-pay | Admitting: Internal Medicine

## 2017-11-13 ENCOUNTER — Encounter: Payer: Self-pay | Admitting: Primary Care

## 2017-11-13 ENCOUNTER — Ambulatory Visit: Payer: BC Managed Care – PPO | Admitting: Primary Care

## 2017-11-13 VITALS — BP 104/64 | HR 78 | Temp 98.2°F | Wt 161.0 lb

## 2017-11-13 DIAGNOSIS — J069 Acute upper respiratory infection, unspecified: Secondary | ICD-10-CM

## 2017-11-13 MED ORDER — GUAIFENESIN-CODEINE 100-10 MG/5ML PO SYRP: 5.0000 mL | ORAL_SOLUTION | Freq: Every evening | ORAL | 0 refills | Status: DC | PRN

## 2017-11-13 MED ORDER — BENZONATATE 200 MG PO CAPS: 200.0000 mg | ORAL_CAPSULE | Freq: Three times a day (TID) | ORAL | 0 refills | Status: DC | PRN

## 2017-11-13 NOTE — Progress Notes (Signed)
Subjective:    Patient ID: Lindsay Sharp, female    DOB: July 04, 1976, 41 y.o.   MRN: [id number]  HPI  Ms. Lindsay Sharp is a 41 year old female who presents today with a chief complaint of cough. She also reports sore throat, nasal congestion, chest congestion. Her symptoms began 6 days ago with a "tickle" to her throat.  She's been taking Dayquil, Nyquil, Mucinex DM with some improvement. She denies fevers, ear pain, sinus pressure. She is mostly worried as her husband is home and has cancer. Her most bothersome symptom is cough which is worse at night.   Review of Systems  Constitutional: Negative for chills and fever.  HENT: Positive for congestion. Negative for postnasal drip and sore throat.   Respiratory: Positive for cough.        Past Medical History:  Diagnosis Date  . Chest pain   . Chicken pox   . Endometriosis   . Family history of breast cancer   . Family history of ovarian cancer   . Hyperlipidemia      Social History   Socioeconomic History  . Marital status: Married    Spouse name: Channing MuttersRoy  . Number of children: 2  . Years of education: Not on file  . Highest education level: Not on file  Social Needs  . Financial resource strain: Not on file  . Food insecurity - worry: Not on file  . Food insecurity - inability: Not on file  . Transportation needs - medical: Not on file  . Transportation needs - non-medical: Not on file  Occupational History  . Not on file  Tobacco Use  . Smoking status: Former Smoker    Packs/day: 1.00    Years: 10.00    Pack years: 10.00    Types: Cigarettes    Last attempt to quit: 08/27/2004    Years since quitting: 13.2  . Smokeless tobacco: Never Used  Substance and Sexual Activity  . Alcohol use: Yes    Alcohol/week: 0.0 oz    Comment: 1-2 glasses/week  . Drug use: No  . Sexual activity: Yes  Other Topics Concern  . Not on file  Social History Narrative  . Not on file    Past Surgical History:  Procedure Laterality Date   . CESAREAN SECTION  2006 and 2009    Family History  Problem Relation Age of Onset  . Hyperlipidemia Father   . Melanoma Father        dx in his 7150s  . Hyperlipidemia Paternal Grandfather   . Breast cancer Paternal Aunt        dbl mastectomy; dx in her 1250s  . Brain cancer Maternal Grandmother        dx in her 6560s  . Prostate cancer Maternal Grandfather [id number]  . Ovarian cancer Paternal Grandmother        dx in her 5160s    Allergies  Allergen Reactions  . Erythromycin Nausea And Vomiting  . Penicillins Hives    Current Outpatient Medications on File Prior to Visit  Medication Sig Dispense Refill  . atorvastatin (LIPITOR) 20 MG tablet TAKE 1 TABLET BY MOUTH EVERY DAY 30 tablet 5  . Coenzyme Q10 (CO Q-10) 200 MG CAPS Take 200 mg by mouth daily.    . norethindrone-ethinyl estradiol (JUNEL FE,GILDESS FE,LOESTRIN FE) 1-20 MG-MCG tablet Take 1 tablet by mouth daily.    . Omega-3 Fatty Acids (FISH OIL) 1000 MG CAPS Take 2,000 mg by mouth daily.    .Marland Kitchen  polyethylene glycol (MIRALAX / GLYCOLAX) packet Take 17 g by mouth daily.     No current facility-administered medications on file prior to visit.     BP 104/64   Pulse 78   Temp 98.2 F (36.8 C) (Oral)   Wt 161 lb (73 kg)   SpO2 98%   BMI 25.22 kg/m    Objective:   Physical Exam  Constitutional: She appears well-nourished.  HENT:  Right Ear: Tympanic membrane and ear canal normal.  Left Ear: Tympanic membrane and ear canal normal.  Nose: Right sinus exhibits no maxillary sinus tenderness and no frontal sinus tenderness. Left sinus exhibits no maxillary sinus tenderness and no frontal sinus tenderness.  Mouth/Throat: Oropharynx is clear and moist.  Eyes: Conjunctivae are normal.  Neck: Neck supple.  Cardiovascular: Normal rate and regular rhythm.  Pulmonary/Chest: Effort normal and breath sounds normal. She has no wheezes. She has no rales.  Dry cough during exam  Lymphadenopathy:    She has no cervical adenopathy.  Skin:  Skin is warm and dry.          Assessment & Plan:  URI:  Cough, congestion x 6 days. Exam today with clear lungs, normal vitals, doesn't appear sickly. Suspect viral URI and will treat with conservative measures. Rx for [employer]on Perles for day cough, Cheratussin for PM cough. Fluids, rest, follow up PRN.  Morrie Sheldonlark,Joey Hudock Kendal, NP

## 2017-11-13 NOTE — Patient Instructions (Signed)
Your symptoms are representative of a viral illness which will resolve on its own over time. Our goal is to treat your symptoms in order to aid your body in the healing process and to make you more comfortable.   You may take Benzonatate capsules for cough. Take 1 capsule by mouth three times daily as needed for cough.  You may take the Cheratussin cough suppressant at bedtime as needed for cough and rest. Caution this medication contains codeine and will make you feel drowsy.  Ensure you are staying hydrated with water and rest.  Please call me Monday next week if no improvement in your symptoms.  It was a pleasure meeting you!   Upper Respiratory Infection, Adult Most upper respiratory infections (URIs) are caused by a virus. A URI affects the nose, throat, and upper air passages. The most common type of URI is often called "the common cold." Follow these instructions at home:  Take medicines only as told by your doctor.  Gargle warm saltwater or take cough drops to comfort your throat as told by your doctor.  Use a warm mist humidifier or inhale steam from a shower to increase air moisture. This may make it easier to breathe.  Drink enough fluid to keep your pee (urine) clear or pale yellow.  Eat soups and other clear broths.  Have a healthy diet.  Rest as needed.  Go back to work when your fever is gone or your doctor says it is okay. ? You may need to stay home longer to avoid giving your URI to others. ? You can also wear a face mask and wash your hands often to prevent spread of the virus.  Use your inhaler more if you have asthma.  Do not use any tobacco products, including cigarettes, chewing tobacco, or electronic cigarettes. If you need help quitting, ask your doctor. Contact a doctor if:  You are getting worse, not better.  Your symptoms are not helped by medicine.  You have chills.  You are getting more short of breath.  You have brown or red mucus.  You  have yellow or brown discharge from your nose.  You have pain in your face, especially when you bend forward.  You have a fever.  You have puffy (swollen) neck glands.  You have pain while swallowing.  You have white areas in the back of your throat. Get help right away if:  You have very bad or constant: ? Headache. ? Ear pain. ? Pain in your forehead, behind your eyes, and over your cheekbones (sinus pain). ? Chest pain.  You have long-lasting (chronic) lung disease and any of the following: ? Wheezing. ? Long-lasting cough. ? Coughing up blood. ? A change in your usual mucus.  You have a stiff neck.  You have changes in your: ? Vision. ? Hearing. ? Thinking. ? Mood. This information is not intended to replace advice given to you by your health care provider. Make sure you discuss any questions you have with your health care provider. Document Released: 05/28/2008 Document Revised: 08/12/2016 Document Reviewed: 03/17/2014 Elsevier Interactive Patient Education  2018 [employer]

## 2018-01-03 ENCOUNTER — Ambulatory Visit: Payer: BC Managed Care – PPO | Admitting: Urgent Care

## 2018-01-03 ENCOUNTER — Ambulatory Visit: Payer: Self-pay

## 2018-01-03 ENCOUNTER — Encounter: Payer: Self-pay | Admitting: Urgent Care

## 2018-01-03 VITALS — BP 130/80 | HR 64 | Temp 99.0°F | Resp 16 | Ht 67.0 in | Wt 165.0 lb

## 2018-01-03 DIAGNOSIS — R05 Cough: Secondary | ICD-10-CM

## 2018-01-03 DIAGNOSIS — J029 Acute pharyngitis, unspecified: Secondary | ICD-10-CM

## 2018-01-03 DIAGNOSIS — J22 Unspecified acute lower respiratory infection: Secondary | ICD-10-CM

## 2018-01-03 DIAGNOSIS — R059 Cough, unspecified: Secondary | ICD-10-CM

## 2018-01-03 MED ORDER — BENZONATATE 100 MG PO CAPS: 100.0000 mg | ORAL_CAPSULE | Freq: Three times a day (TID) | ORAL | 0 refills | Status: DC | PRN

## 2018-01-03 MED ORDER — AZITHROMYCIN 250 MG PO TABS: ORAL_TABLET | ORAL | 0 refills | Status: DC

## 2018-01-03 NOTE — Telephone Encounter (Signed)
  Reason for Disposition . SEVERE coughing spells (e.g., whooping sound after coughing, vomiting after coughing)  Answer Assessment - Initial Assessment Questions 1. ONSET: "When did the cough begin?"      Started Saturday 2. SEVERITY: "How bad is the cough today?"      Moderate 3. RESPIRATORY DISTRESS: "Describe your breathing."      Feels a little short of breath 4. FEVER: "Do you have a fever?" If so, ask: "What is your temperature, how was it measured, and when did it start?"     No - has chills 5. HEMOPTYSIS: "Are you coughing up any blood?" If so ask: "How much?" (flecks, streaks, tablespoons, etc.)     No 6. TREATMENT: "What have you done so far to treat the cough?" (e.g., meds, fluids, humidifier)     Trying Mucinex 7. CARDIAC HISTORY: "Do you have any history of heart disease?" (e.g., heart attack, congestive heart failure)      No 8. LUNG HISTORY: "Do you have any history of lung disease?"  (e.g., pulmonary embolus, asthma, emphysema)     No 9. PE RISK FACTORS: "Do you have a history of blood clots?" (or: recent major surgery, recent prolonged travel, bedridden )     No 10. OTHER SYMPTOMS: "Do you have any other symptoms? (e.g., runny nose, wheezing, chest pain)       Some rattling 11. PREGNANCY: "Is there any chance you are pregnant?" "When was your last menstrual period?"       No 12. TRAVEL: "Have you traveled out of the country in the last month?" (e.g., travel history, exposures)       No  Protocols used: COUGH - ACUTE NON-PRODUCTIVE-A-AH Pt. Has tried Mucinex  - not helping. Appointment made.

## 2018-01-03 NOTE — Progress Notes (Signed)
    MRN: [id number] DOB: 06-07-1976  Subjective:   Lindsay Sharp is a 42 y.o. female presenting for 6 day history of recurrent dry hacking cough, fatigue, shob, malaise, fatigue. She was seen in 11/13/2017 and was treated supportively. Her husband has cancer and she is concerned that she is sick for the second time in as many months. Has tried cough suppression medications prescribed at her last OV with minimal relief. Denies hemoptysis, fever, sinus pain, n/v, abdominal pain. Denies smoking cigarettes.  Maralyn SagoSarah has a current medication list which includes the following prescription(s): atorvastatin, norethindrone-ethinyl estradiol, fish oil, polyethylene glycol, and co q-10. Also is allergic to erythromycin and penicillins.  Maralyn SagoSarah  has a past medical history of Chest pain, Chicken pox, Endometriosis, Family history of breast cancer, Family history of ovarian cancer, and Hyperlipidemia. Also  has a past surgical history that includes Cesarean section (2006 and 2009).  Objective:   Vitals: BP 130/80   Pulse 64   Temp 99 F (37.2 C) (Oral)   Resp 16   Ht 5\' 7"  (1.702 m)   Wt 165 lb (74.8 kg)   SpO2 100%   BMI 25.84 kg/m   Physical Exam  Constitutional: She is oriented to person, place, and time. She appears well-developed and well-nourished.  HENT:  Mouth/Throat: Oropharynx is clear and moist.  TM's intact and without erythema. No sinus tenderness.  Cardiovascular: Normal rate, regular rhythm and intact distal pulses. Exam reveals no gallop and no friction rub.  No murmur heard. Pulmonary/Chest: No respiratory distress. She has no wheezes. She has no rales.  Neurological: She is alert and oriented to person, place, and time.  Skin: Skin is warm and dry.  Psychiatric: She has a normal mood and affect.   Assessment and Plan :   Lower respiratory infection - Plan: benzonatate (TESSALON) 100 MG capsule  Cough  Sore throat  Will cover for infectious source with azithromycin. She  will try Tessalon capsules again. Return-to-clinic precautions discussed, patient verbalized understanding.   Wallis BambergMario Lujuana Kapler, PA-C [facility] [phone] 01/03/2018  5:44 PM

## 2018-01-03 NOTE — Patient Instructions (Addendum)
Cough, Adult A cough helps to clear your throat and lungs. A cough may last only 2-3 weeks (acute), or it may last longer than 8 weeks (chronic). Many different things can cause a cough. A cough may be a sign of an illness or another medical condition. Follow these instructions at home:  Pay attention to any changes in your cough.  Take medicines only as told by your doctor. ? If you were prescribed an antibiotic medicine, take it as told by your doctor. Do not stop taking it even if you start to feel better. ? Talk with your doctor before you try using a cough medicine.  Drink enough fluid to keep your pee (urine) clear or pale yellow.  If the air is dry, use a cold steam vaporizer or humidifier in your home.  Stay away from things that make you cough at work or at home.  If your cough is worse at night, try using extra pillows to raise your head up higher while you sleep.  Do not smoke, and try not to be around smoke. If you need help quitting, ask your doctor.  Do not have caffeine.  Do not drink alcohol.  Rest as needed. Contact a doctor if:  You have new problems (symptoms).  You cough up yellow fluid (pus).  Your cough does not get better after 2-3 weeks, or your cough gets worse.  Medicine does not help your cough and you are not sleeping well.  You have pain that gets worse or pain that is not helped with medicine.  You have a fever.  You are losing weight and you do not know why.  You have night sweats. Get help right away if:  You cough up blood.  You have trouble breathing.  Your heartbeat is very fast. This information is not intended to replace advice given to you by your health care provider. Make sure you discuss any questions you have with your health care provider. Document Released: 08/23/2011 Document Revised: 05/17/2016 Document Reviewed: 02/16/2015 Elsevier Interactive Patient Education  2018 Elsevier Inc.     IF you received an x-ray  today, you will receive an invoice from [facility]. Please contact [facility] at [phone] with questions or concerns regarding your invoice.   IF you received labwork today, you will receive an invoice from [facility]. Please contact [facility] at [phone] with questions or concerns regarding your invoice.   Our billing staff will not be able to assist you with questions regarding bills from these companies.  You will be contacted with the lab results as soon as they are available. The fastest way to get your results is to activate your My Chart account. Instructions are located on the last page of this paperwork. If you have not heard from us regarding the results in 2 weeks, please contact this office.      

## 2018-01-06 ENCOUNTER — Encounter: Payer: Self-pay | Admitting: Internal Medicine

## 2018-01-06 MED ORDER — PROMETHAZINE-DM 6.25-15 MG/5ML PO SYRP: 5.0000 mL | ORAL_SOLUTION | Freq: Four times a day (QID) | ORAL | 0 refills | Status: DC | PRN

## 2018-03-17 ENCOUNTER — Other Ambulatory Visit: Payer: Self-pay | Admitting: Internal Medicine

## 2018-04-15 ENCOUNTER — Other Ambulatory Visit: Payer: Self-pay | Admitting: Internal Medicine

## 2018-04-21 ENCOUNTER — Other Ambulatory Visit: Payer: Self-pay | Admitting: Internal Medicine

## 2018-05-19 ENCOUNTER — Other Ambulatory Visit: Payer: Self-pay | Admitting: Internal Medicine

## 2018-06-04 ENCOUNTER — Encounter: Payer: Self-pay | Admitting: Internal Medicine

## 2018-06-10 ENCOUNTER — Ambulatory Visit (INDEPENDENT_AMBULATORY_CARE_PROVIDER_SITE_OTHER): Payer: BC Managed Care – PPO | Admitting: Internal Medicine

## 2018-06-10 ENCOUNTER — Encounter: Payer: Self-pay | Admitting: Internal Medicine

## 2018-06-10 VITALS — BP 118/70 | HR 62 | Temp 98.1°F | Ht 67.0 in | Wt 164.0 lb

## 2018-06-10 DIAGNOSIS — Z0001 Encounter for general adult medical examination with abnormal findings: Secondary | ICD-10-CM

## 2018-06-10 DIAGNOSIS — N809 Endometriosis, unspecified: Secondary | ICD-10-CM | POA: Diagnosis not present

## 2018-06-10 DIAGNOSIS — G43109 Migraine with aura, not intractable, without status migrainosus: Secondary | ICD-10-CM | POA: Diagnosis not present

## 2018-06-10 DIAGNOSIS — R61 Generalized hyperhidrosis: Secondary | ICD-10-CM

## 2018-06-10 DIAGNOSIS — E7849 Other hyperlipidemia: Secondary | ICD-10-CM | POA: Diagnosis not present

## 2018-06-10 LAB — COMPREHENSIVE METABOLIC PANEL
ALBUMIN: 4.3 g/dL (ref 3.5–5.2)
ALK PHOS: 34 U/L — AB (ref 39–117)
ALT: 12 U/L (ref 0–35)
AST: 12 U/L (ref 0–37)
BILIRUBIN TOTAL: 0.7 mg/dL (ref 0.2–1.2)
BUN: 12 mg/dL (ref 6–23)
CALCIUM: 9.5 mg/dL (ref 8.4–10.5)
CO2: 26 meq/L (ref 19–32)
Chloride: 105 mEq/L (ref 96–112)
Creatinine, Ser: 0.83 mg/dL (ref 0.40–1.20)
GFR: 80.8 mL/min (ref >60.00)
Glucose, Bld: 89 mg/dL (ref 70–99)
Potassium: 4.8 mEq/L (ref 3.5–5.1)
Sodium: 138 mEq/L (ref 135–145)
Total Protein: 7 g/dL (ref 6.0–8.3)

## 2018-06-10 LAB — CBC
HCT: 39 % (ref 36.0–46.0)
HEMOGLOBIN: 13.8 g/dL (ref 12.0–15.0)
MCHC: 35.2 g/dL (ref 30.0–36.0)
MCV: 91.6 fl (ref 78.0–100.0)
PLATELETS: 271 10*3/uL (ref 150.0–400.0)
RBC: 4.26 Mil/uL (ref 3.87–5.11)
RDW: 12.3 % (ref 11.5–15.5)
WBC: 5.1 10*3/uL (ref 4.0–10.5)

## 2018-06-10 LAB — LIPID PANEL
CHOL/HDL RATIO: 2
CHOLESTEROL: 133 mg/dL (ref 0–200)
HDL: 60.4 mg/dL (ref >39.00)
LDL CALC: 55 mg/dL (ref 0–99)
NonHDL: 72.65
TRIGLYCERIDES: 89 mg/dL (ref 0.0–149.0)
VLDL: 17.8 mg/dL (ref 0.0–40.0)

## 2018-06-10 LAB — FOLLICLE STIMULATING HORMONE: FSH: 1.1 m[IU]/mL

## 2018-06-10 LAB — VITAMIN D 25 HYDROXY (VIT D DEFICIENCY, FRACTURES): VITD: 41.06 ng/mL (ref 30.00–100.00)

## 2018-06-10 LAB — LUTEINIZING HORMONE: LH: 0.1 m[IU]/mL

## 2018-06-10 NOTE — Assessment & Plan Note (Signed)
Continue OCP's per GYN

## 2018-06-10 NOTE — Progress Notes (Signed)
Subjective:    Patient ID: Lindsay Sharp, female    DOB: 1976-10-18, 42 y.o.   MRN: [id number]  HPI  Pt presents to the clinic today for her annual exam. She is also due to follow up chronic conditions.  HLD: Her last LDL was 69, 01/2017. She denies myalgias on Atorvastatin. She tries to consume a low fat diet.  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 She takes Imitrex as needed with good relief. She is following with neurology.  Flu: 10/2017 Tetanus: 05/2016 Pap Smear: 04/2017, [facility] Mammogram: 2014, [facility] Vision Screening: annually Dentist: biannually  Diet: She does eat meat. She consumes fruits and veggies daily. She tries to avoid fried foods. She drinks mostly water. Exercise: Running 3.5 miles, 3 days per week  Review of Systems      Past Medical History:  Diagnosis Date  . Chest pain   . Chicken pox   . Endometriosis   . Family history of breast cancer   . Family history of ovarian cancer   . Hyperlipidemia     Current Outpatient Medications  Medication Sig Dispense Refill  . atorvastatin (LIPITOR) 20 MG tablet TAKE 1 TABLET (20 MG TOTAL) BY MOUTH DAILY. MUST SCHEDULE PHYSICAL EXAM FOR REFILLS 30 tablet 0  . azithromycin (ZITHROMAX) 250 MG tablet Start with 2 tablets today, then 1 daily thereafter. 6 tablet 0  . benzonatate (TESSALON) 100 MG capsule Take 1-2 capsules (100-200 mg total) by mouth 3 (three) times daily as needed for cough. 60 capsule 0  . Coenzyme Q10 (CO Q-10) 200 MG CAPS Take 200 mg by mouth daily.    . norethindrone-ethinyl estradiol (JUNEL FE,GILDESS FE,LOESTRIN FE) 1-20 MG-MCG tablet Take 1 tablet by mouth daily.    . Omega-3 Fatty Acids (FISH OIL) 1000 MG CAPS Take 2,000 mg by mouth daily.    . polyethylene glycol (MIRALAX / GLYCOLAX) packet Take 17 g by mouth daily.    . promethazine-dextromethorphan (PROMETHAZINE-DM) 6.25-15 MG/5ML syrup Take 5 mLs by mouth 4 (four) times daily as needed  for cough. 118 mL 0   No current facility-administered medications for this visit.     Allergies  Allergen Reactions  . Erythromycin Nausea And Vomiting  . Penicillins Hives    Family History  Problem Relation Age of Onset  . Hyperlipidemia Father   . Melanoma Father        dx in his 29s  . Hyperlipidemia Paternal Grandfather   . Breast cancer Paternal Aunt        dbl mastectomy; dx in her 37s  . Brain cancer Maternal Grandmother        dx in her 59s  . Prostate cancer Maternal Grandfather 12  . Ovarian cancer Paternal Grandmother        dx in her 73s    Social History   Socioeconomic History  . Marital status: Married    Spouse name: Channing Mutters  . Number of children: 2  . Years of education: Not on file  . Highest education level: Not on file  Occupational History  . Not on file  Social Needs  . Financial resource strain: Not on file  . Food insecurity:    Worry: Not on file    Inability: Not on file  . Transportation needs:    Medical: Not on file    Non-medical: Not on file  Tobacco Use  . Smoking status: Former Smoker    Packs/day:  1.00    Years: 10.00    Pack years: 10.00    Types: Cigarettes    Last attempt to quit: 08/27/2004    Years since quitting: 13.7  . Smokeless tobacco: Never Used  Substance and Sexual Activity  . Alcohol use: Yes    Alcohol/week: 0.0 oz    Comment: 1-2 glasses/week  . Drug use: No  . Sexual activity: Yes  Lifestyle  . Physical activity:    Days per week: Not on file    Minutes per session: Not on file  . Stress: Not on file  Relationships  . Social connections:    Talks on phone: Not on file    Gets together: Not on file    Attends religious service: Not on file    Active member of club or organization: Not on file    Attends meetings of clubs or organizations: Not on file    Relationship status: Not on file  . Intimate partner violence:    Fear of current or ex partner: Not on file    Emotionally abused: Not on file     Physically abused: Not on file    Forced sexual activity: Not on file  Other Topics Concern  . Not on file  Social History Narrative  . Not on file     Constitutional: Denies fever, malaise, fatigue, headache or abrupt weight changes.  HEENT: Denies eye pain, eye redness, ear pain, ringing in the ears, wax buildup, runny nose, nasal congestion, bloody nose, or sore throat. Respiratory: Denies difficulty breathing, shortness of breath, cough or sputum production.   Cardiovascular: Denies chest pain, chest tightness, palpitations or swelling in the hands or feet.  Gastrointestinal: Pt reports constipation. Denies abdominal pain, bloating, diarrhea or blood in the stool.  GU: Denies urgency, frequency, pain with urination, burning sensation, blood in urine, odor or discharge. Musculoskeletal: Denies decrease in range of motion, difficulty with gait, muscle pain or joint pain and swelling.  Skin: Denies redness, rashes, lesions or ulcercations.  Neurological: Pt reports night sweats. Denies dizziness, difficulty with memory, difficulty with speech or problems with balance and coordination.  Psych: Denies anxiety, depression, SI/HI.  No other specific complaints in a complete review of systems (except as listed in HPI above).  Objective:   Physical Exam   BP 118/70   Pulse 62   Temp 98.1 F (36.7 C) (Oral)   Ht 5\' 7"  (1.702 m)   Wt 164 lb (74.4 kg)   SpO2 98%   BMI 25.69 kg/m  Wt Readings from Last 3 Encounters:  06/10/18 164 lb (74.4 kg)  01/03/18 165 lb (74.8 kg)  11/13/17 161 lb (73 kg)    General: Appears her stated age, well developed, well nourished in NAD. Skin: Warm, dry and intact.  HEENT: Head: normal shape and size; Eyes: sclera white, no icterus, conjunctiva pink, PERRLA and EOMs intact; Ears: Tm's gray and intact, normal light reflex; Throat/Mouth: Teeth present, mucosa pink and moist, no exudate, lesions or ulcerations noted. R>L tonsillar enlargement without  exudate. Neck:  Neck supple, trachea midline. No masses, lumps or thyromegaly present.  Cardiovascular: Normal rate and rhythm. S1,S2 noted.  No murmur, rubs or gallops noted. No JVD or BLE edema.  Pulmonary/Chest: Normal effort and positive vesicular breath sounds. No respiratory distress. No wheezes, rales or ronchi noted.  Abdomen: Soft and mildly tender in the LUQ. Normal bowel sounds. No distention or masses noted. Liver, spleen and kidneys non palpable. Musculoskeletal: Strength 5/5 BUE/BLE.  No difficulty with gait.  Neurological: Alert and oriented. Cranial nerves II-XII grossly intact. Coordination normal.  Psychiatric: Mood and affect normal. Behavior is normal. Judgment and thought content normal.    BMET    Component Value Date/Time   NA 136 04/23/2017 1604   K 4.6 04/23/2017 1604   CL 105 04/23/2017 1604   CO2 25 04/23/2017 1604   GLUCOSE 85 04/23/2017 1604   BUN 15 04/23/2017 1604   CREATININE 0.88 04/23/2017 1604   CALCIUM 9.6 04/23/2017 1604    Lipid Panel     Component Value Date/Time   CHOL 138 02/12/2017 0807   TRIG 77.0 02/12/2017 0807   HDL 52.80 02/12/2017 0807   CHOLHDL 3 02/12/2017 0807   VLDL 15.4 02/12/2017 0807   LDLCALC 69 02/12/2017 0807    CBC    Component Value Date/Time   WBC 7.9 06/22/2016 1405   RBC 4.15 06/22/2016 1405   HGB 12.6 06/22/2016 1405   HCT 37.8 06/22/2016 1405   PLT 256.0 06/22/2016 1405   MCV 91.3 06/22/2016 1405   MCHC 33.2 06/22/2016 1405   RDW 12.5 06/22/2016 1405   LYMPHSABS 3.3 01/07/2008 2035   MONOABS 0.6 01/07/2008 2035   EOSABS 0.1 01/07/2008 2035   BASOSABS 0.0 01/07/2008 2035    Hgb A1C Lab Results  Component Value Date   HGBA1C 5.1 04/23/2017           Assessment & Plan:   Preventative Health Maintenance:  Encouraged her to get a flu shot in the fall Tetanus UTD Pap smear UTD She will call [facility] to schedule her mammogram Encouraged her to consume a balanced diet and exercise  regimen Advised her to see an eye doctor and dentist annually Will check CBC, CMET, Lipid and Vit D today  Night Sweats:  Will check FSH/LH today On continuous OCP's for endometriosis  RTC in 1 year, sooner if needed [name], NP

## 2018-06-10 NOTE — Patient Instructions (Signed)

## 2018-06-10 NOTE — Assessment & Plan Note (Signed)
CMET and Lipid profile today Encouraged her to consume a low fat diet Continue Atorvastatin, will refill once labs are back

## 2018-06-10 NOTE — Assessment & Plan Note (Signed)
Good relief with Imitrex prn She will continue to follow with neurology

## 2018-06-18 ENCOUNTER — Other Ambulatory Visit: Payer: Self-pay | Admitting: Internal Medicine

## 2019-04-07 ENCOUNTER — Ambulatory Visit (INDEPENDENT_AMBULATORY_CARE_PROVIDER_SITE_OTHER): Payer: BC Managed Care – PPO | Admitting: Internal Medicine

## 2019-04-07 ENCOUNTER — Encounter: Payer: Self-pay | Admitting: Internal Medicine

## 2019-04-07 VITALS — Temp 100.8°F

## 2019-04-07 DIAGNOSIS — R509 Fever, unspecified: Secondary | ICD-10-CM | POA: Diagnosis not present

## 2019-04-07 DIAGNOSIS — J029 Acute pharyngitis, unspecified: Secondary | ICD-10-CM | POA: Diagnosis not present

## 2019-04-07 NOTE — Progress Notes (Signed)
Virtual Visit via Video Note  I connected with Lindsay Sharp on 04/07/19 at  4:00 PM EDT by a video enabled telemedicine application and verified that I am speaking with the correct person using two identifiers.   I discussed the limitations of evaluation and management by telemedicine and the availability of in person appointments. The patient expressed understanding and agreed to proceed.  Patient Location: Home Provider Location: Office  History of Present Illness:  Pt reports fever and sore throat. This started this morning. She reports her throat feels scratchy. She denies difficulty swallowing. She denies runny nose, nasal congestion, ear pain, cough or shortness of breath. She reports fever up to 100.8, but denies chills, body aches, rash, nausea, vomiting or diarrhea. She has not had any sick contacts. She does have a history of seasonal allergies. She has no known COVID contact that she is aware of.   Observations/Objective:  Alert and oriented x 3 NAD Mild erythema noted in the back throat, + PND, no exudate noted No obvious rash concerning for scarltetina  Assessment and Plan:  Sore Throat, Fever:  Likely viral  Encouraged salt water gargles for comfort Start Zyrtec 10 mg PO daily Can take Ibuprofen 600 mg TID prn with food to help reduce pain and inflammation  Follow Up Instructions:    I discussed the assessment and treatment plan with the patient. The patient was provided an opportunity to ask questions and all were answered. The patient agreed with the plan and demonstrated an understanding of the instructions.   The patient was advised to call back or seek an in-person evaluation if the symptoms worsen or if the condition fails to improve as anticipated.    [name], NP

## 2019-04-07 NOTE — Patient Instructions (Signed)
Sore Throat  When you have a sore throat, your throat may feel:  · Tender.  · Burning.  · Irritated.  · Scratchy.  · Painful when you swallow.  · Painful when you talk.  Many things can cause a sore throat, such as:  · An infection.  · Allergies.  · Dry air.  · Smoke or pollution.  · Radiation treatment.  · Gastroesophageal reflux disease (GERD).  · A tumor.  A sore throat can be the first sign of another sickness. It can happen with other problems, like:  · Coughing.  · Sneezing.  · Fever.  · Swelling in the neck.  Most sore throats go away without treatment.  Follow these instructions at home:         · Take over-the-counter medicines only as told by your doctor.  ? If your child has a sore throat, do not give your child aspirin.  · Drink enough fluids to keep your pee (urine) pale yellow.  · Rest when you feel you need to.  · To help with pain:  ? Sip warm liquids, such as broth, herbal tea, or warm water.  ? Eat or drink cold or frozen liquids, such as frozen ice pops.  ? Gargle with a salt-water mixture 3-4 times a day or as needed. To make a salt-water mixture, add ½-1 tsp (3-6 g) of salt to 1 cup (237 mL) of warm water. Mix it until you cannot see the salt anymore.  ? Suck on hard candy or throat lozenges.  ? Put a cool-mist humidifier in your bedroom at night.  ? Sit in the bathroom with the door closed for 5-10 minutes while you run hot water in the shower.  · Do not use any products that contain nicotine or tobacco, such as cigarettes, e-cigarettes, and chewing tobacco. If you need help quitting, ask your doctor.  · Wash your hands well and often with soap and water. If soap and water are not available, use hand sanitizer.  Contact a doctor if:  · You have a fever for more than 2-3 days.  · You keep having symptoms for more than 2-3 days.  · Your throat does not get better in 7 days.  · You have a fever and your symptoms suddenly get worse.  · Your child who is 3 months to 3 years old has a temperature of  102.2°F (39°C) or higher.  Get help right away if:  · You have trouble breathing.  · You cannot swallow fluids, soft foods, or your saliva.  · You have swelling in your throat or neck that gets worse.  · You keep feeling sick to your stomach (nauseous).  · You keep throwing up (vomiting).  Summary  · A sore throat is pain, burning, irritation, or scratchiness in the throat. Many things can cause a sore throat.  · Take over-the-counter medicines only as told by your doctor. Do not give your child aspirin.  · Drink plenty of fluids, and rest as needed.  · Contact a doctor if your symptoms get worse or your sore throat does not get better within 7 days.  This information is not intended to replace advice given to you by your health care provider. Make sure you discuss any questions you have with your health care provider.  Document Released: 09/18/2008 Document Revised: 05/12/2018 Document Reviewed: 05/12/2018  Elsevier Interactive Patient Education © 2019 [employer]

## 2019-06-04 ENCOUNTER — Telehealth: Payer: Self-pay | Admitting: Internal Medicine

## 2019-06-04 NOTE — Telephone Encounter (Signed)
Best number [phone] Pt called stating pharmacy as been trying to contact you to refill her Atorvastatin  [facility] [place] Pt is out of her meds

## 2019-06-05 MED ORDER — ATORVASTATIN CALCIUM 20 MG PO TABS: 20.0000 mg | ORAL_TABLET | Freq: Every day | ORAL | 0 refills | Status: DC

## 2019-06-05 NOTE — Telephone Encounter (Signed)
Left detailed msg on VM per HIPAA  Rx sent through e-scribe  Pt needs to schedule CPE now is due

## 2019-06-30 ENCOUNTER — Other Ambulatory Visit: Payer: Self-pay | Admitting: Internal Medicine

## 2019-07-31 ENCOUNTER — Other Ambulatory Visit: Payer: Self-pay | Admitting: Internal Medicine

## 2019-08-30 ENCOUNTER — Other Ambulatory Visit: Payer: Self-pay | Admitting: Internal Medicine

## 2019-09-10 ENCOUNTER — Ambulatory Visit (INDEPENDENT_AMBULATORY_CARE_PROVIDER_SITE_OTHER): Payer: BC Managed Care – PPO | Admitting: Internal Medicine

## 2019-09-10 ENCOUNTER — Other Ambulatory Visit: Payer: Self-pay

## 2019-09-10 ENCOUNTER — Encounter: Payer: Self-pay | Admitting: Internal Medicine

## 2019-09-10 VITALS — BP 112/64 | HR 69 | Temp 98.7°F | Ht 67.25 in | Wt 161.0 lb

## 2019-09-10 DIAGNOSIS — Z0000 Encounter for general adult medical examination without abnormal findings: Secondary | ICD-10-CM | POA: Diagnosis not present

## 2019-09-10 DIAGNOSIS — E7849 Other hyperlipidemia: Secondary | ICD-10-CM | POA: Diagnosis not present

## 2019-09-10 DIAGNOSIS — F419 Anxiety disorder, unspecified: Secondary | ICD-10-CM | POA: Insufficient documentation

## 2019-09-10 LAB — COMPREHENSIVE METABOLIC PANEL
ALT: 13 U/L (ref 0–35)
AST: 16 U/L (ref 0–37)
Albumin: 4.3 g/dL (ref 3.5–5.2)
Alkaline Phosphatase: 35 U/L — ABNORMAL LOW (ref 39–117)
BUN: 12 mg/dL (ref 6–23)
CO2: 26 mEq/L (ref 19–32)
Calcium: 9.5 mg/dL (ref 8.4–10.5)
Chloride: 105 mEq/L (ref 96–112)
Creatinine, Ser: 0.9 mg/dL (ref 0.40–1.20)
GFR: 68.81 mL/min (ref >60.00)
Glucose, Bld: 79 mg/dL (ref 70–99)
Potassium: 4.6 mEq/L (ref 3.5–5.1)
Sodium: 138 mEq/L (ref 135–145)
Total Bilirubin: 0.6 mg/dL (ref 0.2–1.2)
Total Protein: 7.3 g/dL (ref 6.0–8.3)

## 2019-09-10 LAB — CBC
HCT: 41.1 % (ref 36.0–46.0)
Hemoglobin: 13.6 g/dL (ref 12.0–15.0)
MCHC: 33.2 g/dL (ref 30.0–36.0)
MCV: 94.2 fl (ref 78.0–100.0)
Platelets: 280 10*3/uL (ref 150.0–400.0)
RBC: 4.36 Mil/uL (ref 3.87–5.11)
RDW: 12.6 % (ref 11.5–15.5)
WBC: 7.6 10*3/uL (ref 4.0–10.5)

## 2019-09-10 LAB — LIPID PANEL
Cholesterol: 146 mg/dL (ref 0–200)
HDL: 62.4 mg/dL (ref >39.00)
LDL Cholesterol: 62 mg/dL (ref 0–99)
NonHDL: 83.17
Total CHOL/HDL Ratio: 2
Triglycerides: 107 mg/dL (ref 0.0–149.0)
VLDL: 21.4 mg/dL (ref 0.0–40.0)

## 2019-09-10 LAB — VITAMIN D 25 HYDROXY (VIT D DEFICIENCY, FRACTURES): VITD: 52.26 ng/mL (ref 30.00–100.00)

## 2019-09-10 MED ORDER — ALPRAZOLAM 0.25 MG PO TABS: 0.2500 mg | ORAL_TABLET | Freq: Two times a day (BID) | ORAL | 0 refills | Status: DC | PRN

## 2019-09-10 NOTE — Progress Notes (Signed)
Subjective:    Patient ID: Lindsay Sharp, female    DOB: Apr 18, 1976, 43 y.o.   MRN: [id number]  HPI  Pt presents to the clinic today for her annual exam.   HLD: Her last LDL was 55, 05/2018. She denies myalgias on Atorvastatin. She tries to consume a low fat diet.  She would like a RX for Xanax. She reports after her husband died, her GYN gave her Xanax 0.5mg . She received 30 tablets and still has 2 left from that prescription. She would like a refill today.  Flu: 09/2018 Tetanus: 05/2016 Pap Smear: 08/2017 Mammogram: 02/2013, scheduled 11/2019 [facility]) Vision Screening: annually Dentist: biannually  Diet: She does eat meat. She consumes fruits and veggies daily. She occasionally eats fried foods. She drinks mostly water, tea. Exercise: Running 2-3 days per week  Review of Systems      Past Medical History:  Diagnosis Date  . Chest pain   . Chicken pox   . Endometriosis   . Family history of breast cancer   . Family history of ovarian cancer   . Hyperlipidemia     Current Outpatient Medications  Medication Sig Dispense Refill  . atorvastatin (LIPITOR) 20 MG tablet TAKE 1 TABLET BY MOUTH EVERY DAY 30 tablet 0  . co-enzyme Q-10 30 MG capsule Take 30 mg by mouth 3 (three) times daily.    . norethindrone-ethinyl estradiol (JUNEL FE,GILDESS FE,LOESTRIN FE) 1-20 MG-MCG tablet Take 1 tablet by mouth daily.    . Omega-3 Fatty Acids (FISH OIL) 1000 MG CAPS Take 2,000 mg by mouth daily.    . polyethylene glycol (MIRALAX / GLYCOLAX) packet Take 17 g by mouth daily.     No current facility-administered medications for this visit.     Allergies  Allergen Reactions  . Erythromycin Nausea And Vomiting  . Penicillins Hives    Family History  Problem Relation Age of Onset  . Hyperlipidemia Father   . Melanoma Father        dx in his 4850s  . Hyperlipidemia Paternal Grandfather   . Breast cancer Paternal Aunt        dbl mastectomy; dx in her 4050s  . Brain cancer  Maternal Grandmother        dx in her 6660s  . Prostate cancer Maternal Grandfather [id number]  . Ovarian cancer Paternal Grandmother        dx in her 6760s    Social History   Socioeconomic History  . Marital status: Married    Spouse name: Channing MuttersRoy  . Number of children: 2  . Years of education: Not on file  . Highest education level: Not on file  Occupational History  . Not on file  Social Needs  . Financial resource strain: Not on file  . Food insecurity    Worry: Not on file    Inability: Not on file  . Transportation needs    Medical: Not on file    Non-medical: Not on file  Tobacco Use  . Smoking status: Former Smoker    Packs/day: 1.00    Years: 10.00    Pack years: 10.00    Types: Cigarettes    Quit date: 08/27/2004    Years since quitting: 15.0  . Smokeless tobacco: Never Used  Substance and Sexual Activity  . Alcohol use: Yes    Alcohol/week: 0.0 standard drinks    Comment: 1-2 glasses/week  . Drug use: No  . Sexual activity: Yes  Lifestyle  . Physical  activity    Days per week: Not on file    Minutes per session: Not on file  . Stress: Not on file  Relationships  . Social Herbalist on phone: Not on file    Gets together: Not on file    Attends religious service: Not on file    Active member of club or organization: Not on file    Attends meetings of clubs or organizations: Not on file    Relationship status: Not on file  . Intimate partner violence    Fear of current or ex partner: Not on file    Emotionally abused: Not on file    Physically abused: Not on file    Forced sexual activity: Not on file  Other Topics Concern  . Not on file  Social History Narrative  . Not on file     Constitutional: Denies fever, malaise, fatigue, headache or abrupt weight changes.  HEENT: Denies eye pain, eye redness, ear pain, ringing in the ears, wax buildup, runny nose, nasal congestion, bloody nose, or sore throat. Respiratory: Denies difficulty breathing,  shortness of breath, cough or sputum production.   Cardiovascular: Denies chest pain, chest tightness, palpitations or swelling in the hands or feet.  Gastrointestinal: Pt reports intermittent reflux. Denies abdominal pain, bloating, constipation, diarrhea or blood in the stool.  GU: Denies urgency, frequency, pain with urination, burning sensation, blood in urine, odor or discharge. Musculoskeletal: Denies decrease in range of motion, difficulty with gait, muscle pain or joint pain and swelling.  Skin: Denies redness, rashes, lesions or ulcercations.  Neurological: Denies dizziness, difficulty with memory, difficulty with speech or problems with balance and coordination.  Psych: Pt reports intermittent anxiety. Denies depression, SI/HI.  No other specific complaints in a complete review of systems (except as listed in HPI above).  Objective:   Physical Exam  BP 112/64   Pulse 69   Temp 98.7 F (37.1 C) (Temporal)   Ht 5' 7.25" (1.708 m)   Wt 161 lb (73 kg)   SpO2 98%   BMI 25.03 kg/m  Wt Readings from Last 3 Encounters:  09/10/19 161 lb (73 kg)  06/10/18 164 lb (74.4 kg)  01/03/18 165 lb (74.8 kg)    General: Appears her stated age, well developed, well nourished in NAD. Skin: Warm, dry and intact. No rashesnoted. HEENT: Head: normal shape and size; Eyes: sclera white, no icterus, conjunctiva pink, PERRLA and EOMs intact; Ears: Tm's gray and intact, normal light reflex;  Neck:  Neck supple, trachea midline. No masses, lumps or thyromegaly present.  Cardiovascular: Normal rate and rhythm. S1,S2 noted.  No murmur, rubs or gallops noted. No JVD or BLE edema.  Pulmonary/Chest: Normal effort and positive vesicular breath sounds. No respiratory distress. No wheezes, rales or ronchi noted.  Abdomen: Soft and nontender. Normal bowel sounds. No distention or masses noted. Liver, spleen and kidneys non palpable. Musculoskeletal: Strength 5/5 BUE/BLE. No difficulty with gait.   Neurological: Alert and oriented. Cranial nerves II-XII grossly intact. Coordination normal.  Psychiatric: Mood and affect normal. Behavior is normal. Judgment and thought content normal.     BMET    Component Value Date/Time   NA 138 06/10/2018 0826   K 4.8 06/10/2018 0826   CL 105 06/10/2018 0826   CO2 26 06/10/2018 0826   GLUCOSE 89 06/10/2018 0826   BUN 12 06/10/2018 0826   CREATININE 0.83 06/10/2018 0826   CALCIUM 9.5 06/10/2018 0826    Lipid Panel  Component Value Date/Time   CHOL 133 06/10/2018 0826   TRIG 89.0 06/10/2018 0826   HDL 60.40 06/10/2018 0826   CHOLHDL 2 06/10/2018 0826   VLDL 17.8 06/10/2018 0826   LDLCALC 55 06/10/2018 0826    CBC    Component Value Date/Time   WBC 5.1 06/10/2018 0826   RBC 4.26 06/10/2018 0826   HGB 13.8 06/10/2018 0826   HCT 39.0 06/10/2018 0826   PLT 271.0 06/10/2018 0826   MCV 91.6 06/10/2018 0826   MCHC 35.2 06/10/2018 0826   RDW 12.3 06/10/2018 0826   LYMPHSABS 3.3 01/07/2008 2035   MONOABS 0.6 01/07/2008 2035   EOSABS 0.1 01/07/2008 2035   BASOSABS 0.0 01/07/2008 2035    Hgb A1C Lab Results  Component Value Date   HGBA1C 5.1 04/23/2017            Assessment & Plan:   Preventative Health Maintenance:  Flu shot today Tetanus UTD Pap smear UTD Mammogram scheduled- will request copy from [facility] Encouraged her to consume a balanced diet and exercise regimen Advised her to see an eye doctor and dentist annually Will check CBC, CMET, Lipid and Vit D  RTC in 1 year, sooner if needed [name], NP

## 2019-09-10 NOTE — Patient Instructions (Signed)
Health Maintenance, Female Adopting a healthy lifestyle and getting preventive care are important in promoting health and wellness. Ask your health care provider about:  The right schedule for you to have regular tests and exams.  Things you can do on your own to prevent diseases and keep yourself healthy. What should I know about diet, weight, and exercise? Eat a healthy diet   Eat a diet that includes plenty of vegetables, fruits, low-fat dairy products, and lean protein.  Do not eat a lot of foods that are high in solid fats, added sugars, or sodium. Maintain a healthy weight Body mass index (BMI) is used to identify weight problems. It estimates body fat based on height and weight. Your health care provider can help determine your BMI and help you achieve or maintain a healthy weight. Get regular exercise Get regular exercise. This is one of the most important things you can do for your health. Most adults should:  Exercise for at least 150 minutes each week. The exercise should increase your heart rate and make you sweat (moderate-intensity exercise).  Do strengthening exercises at least twice a week. This is in addition to the moderate-intensity exercise.  Spend less time sitting. Even light physical activity can be beneficial. Watch cholesterol and blood lipids Have your blood tested for lipids and cholesterol at 41 years of age, then have this test every 5 years. Have your cholesterol levels checked more often if:  Your lipid or cholesterol levels are high.  You are older than 40 years of age.  You are at high risk for heart disease. What should I know about cancer screening? Depending on your health history and family history, you may need to have cancer screening at various ages. This may include screening for:  Breast cancer.  Cervical cancer.  Colorectal cancer.  Skin cancer.  Lung cancer. What should I know about heart disease, diabetes, and high blood  pressure? Blood pressure and heart disease  High blood pressure causes heart disease and increases the risk of stroke. This is more likely to develop in people who have high blood pressure readings, are of African descent, or are overweight.  Have your blood pressure checked: ? Every 3-5 years if you are 18-39 years of age. ? Every year if you are 40 years old or older. Diabetes Have regular diabetes screenings. This checks your fasting blood sugar level. Have the screening done:  Once every three years after age 40 if you are at a normal weight and have a low risk for diabetes.  More often and at a younger age if you are overweight or have a high risk for diabetes. What should I know about preventing infection? Hepatitis B If you have a higher risk for hepatitis B, you should be screened for this virus. Talk with your health care provider to find out if you are at risk for hepatitis B infection. Hepatitis C Testing is recommended for:  Everyone born from 1945 through 1965.  Anyone with known risk factors for hepatitis C. Sexually transmitted infections (STIs)  Get screened for STIs, including gonorrhea and chlamydia, if: ? You are sexually active and are younger than 41 years of age. ? You are older than 41 years of age and your health care provider tells you that you are at risk for this type of infection. ? Your sexual activity has changed since you were last screened, and you are at increased risk for chlamydia or gonorrhea. Ask your health care provider if   you are at risk.  Ask your health care provider about whether you are at high risk for HIV. Your health care provider may recommend a prescription medicine to help prevent HIV infection. If you choose to take medicine to prevent HIV, you should first get tested for HIV. You should then be tested every 3 months for as long as you are taking the medicine. Pregnancy  If you are about to stop having your period (premenopausal) and  you may become pregnant, seek counseling before you get pregnant.  Take 400 to 800 micrograms (mcg) of folic acid every day if you become pregnant.  Ask for birth control (contraception) if you want to prevent pregnancy. Osteoporosis and menopause Osteoporosis is a disease in which the bones lose minerals and strength with aging. This can result in bone fractures. If you are 65 years old or older, or if you are at risk for osteoporosis and fractures, ask your health care provider if you should:  Be screened for bone loss.  Take a calcium or vitamin D supplement to lower your risk of fractures.  Be given hormone replacement therapy (HRT) to treat symptoms of menopause. Follow these instructions at home: Lifestyle  Do not use any products that contain nicotine or tobacco, such as cigarettes, e-cigarettes, and chewing tobacco. If you need help quitting, ask your health care provider.  Do not use street drugs.  Do not share needles.  Ask your health care provider for help if you need support or information about quitting drugs. Alcohol use  Do not drink alcohol if: ? Your health care provider tells you not to drink. ? You are pregnant, may be pregnant, or are planning to become pregnant.  If you drink alcohol: ? Limit how much you use to 0-1 drink a day. ? Limit intake if you are breastfeeding.  Be aware of how much alcohol is in your drink. In the [place] one drink equals one 12 oz bottle of beer (355 mL), one 5 oz glass of wine (148 mL), or one 1 oz glass of hard liquor (44 mL). General instructions  Schedule regular health, dental, and eye exams.  Stay current with your vaccines.  Tell your health care provider if: ? You often feel depressed. ? You have ever been abused or do not feel safe at home. Summary  Adopting a healthy lifestyle and getting preventive care are important in promoting health and wellness.  Follow your health care provider's instructions about healthy  diet, exercising, and getting tested or screened for diseases.  Follow your health care provider's instructions on monitoring your cholesterol and blood pressure. This information is not intended to replace advice given to you by your health care provider. Make sure you discuss any questions you have with your health care provider. Document Released: 06/25/2011 Document Revised: 12/03/2018 Document Reviewed: 12/03/2018 Elsevier Patient Education  2020 [employer]  

## 2019-09-10 NOTE — Assessment & Plan Note (Signed)
CMET and Lipid profile today Encouraged her to consume a low fat diet Continue Atorvastatin 

## 2019-09-10 NOTE — Assessment & Plan Note (Signed)
Grief related due to loss of husband RX for Xanax 0.25 mg daily prn #30, 0 refills

## 2019-09-29 ENCOUNTER — Other Ambulatory Visit: Payer: Self-pay | Admitting: Internal Medicine

## 2019-12-02 ENCOUNTER — Encounter: Payer: Self-pay | Admitting: Family Medicine

## 2019-12-02 ENCOUNTER — Ambulatory Visit: Payer: BC Managed Care – PPO | Admitting: Family Medicine

## 2019-12-02 ENCOUNTER — Other Ambulatory Visit: Payer: Self-pay

## 2019-12-02 VITALS — BP 112/76 | HR 59 | Temp 96.9°F | Ht 67.25 in | Wt 159.2 lb

## 2019-12-02 DIAGNOSIS — R10A1 Flank pain, right side: Secondary | ICD-10-CM | POA: Insufficient documentation

## 2019-12-02 DIAGNOSIS — R1011 Right upper quadrant pain: Secondary | ICD-10-CM

## 2019-12-02 DIAGNOSIS — R109 Unspecified abdominal pain: Secondary | ICD-10-CM

## 2019-12-02 LAB — CBC WITH DIFFERENTIAL/PLATELET
Basophils Absolute: 0 10*3/uL (ref 0.0–0.1)
Basophils Relative: 0.7 % (ref 0.0–3.0)
Eosinophils Absolute: 0 10*3/uL (ref 0.0–0.7)
Eosinophils Relative: 0.6 % (ref 0.0–5.0)
HCT: 41.1 % (ref 36.0–46.0)
Hemoglobin: 14 g/dL (ref 12.0–15.0)
Lymphocytes Relative: 41 % (ref 12.0–46.0)
Lymphs Abs: 2.7 10*3/uL (ref 0.7–4.0)
MCHC: 34.1 g/dL (ref 30.0–36.0)
MCV: 92.1 fl (ref 78.0–100.0)
Monocytes Absolute: 0.4 10*3/uL (ref 0.1–1.0)
Monocytes Relative: 5.4 % (ref 3.0–12.0)
Neutro Abs: 3.4 10*3/uL (ref 1.4–7.7)
Neutrophils Relative %: 52.3 % (ref 43.0–77.0)
Platelets: 271 10*3/uL (ref 150.0–400.0)
RBC: 4.46 Mil/uL (ref 3.87–5.11)
RDW: 12.2 % (ref 11.5–15.5)
WBC: 6.5 10*3/uL (ref 4.0–10.5)

## 2019-12-02 LAB — POC URINALSYSI DIPSTICK (AUTOMATED)
Bilirubin, UA: NEGATIVE
Blood, UA: NEGATIVE
Glucose, UA: NEGATIVE
Ketones, UA: NEGATIVE
Leukocytes, UA: NEGATIVE
Nitrite, UA: NEGATIVE
Protein, UA: NEGATIVE
Spec Grav, UA: 1.025 (ref 1.010–1.025)
Urobilinogen, UA: 0.2 E.U./dL
pH, UA: 6 (ref 5.0–8.0)

## 2019-12-02 LAB — BASIC METABOLIC PANEL
BUN: 18 mg/dL (ref 6–23)
CO2: 25 mEq/L (ref 19–32)
Calcium: 9.6 mg/dL (ref 8.4–10.5)
Chloride: 105 mEq/L (ref 96–112)
Creatinine, Ser: 0.88 mg/dL (ref 0.40–1.20)
GFR: 70.54 mL/min (ref >60.00)
Glucose, Bld: 91 mg/dL (ref 70–99)
Potassium: 4.7 mEq/L (ref 3.5–5.1)
Sodium: 137 mEq/L (ref 135–145)

## 2019-12-02 LAB — HEPATIC FUNCTION PANEL
ALT: 15 U/L (ref 0–35)
AST: 16 U/L (ref 0–37)
Albumin: 4.5 g/dL (ref 3.5–5.2)
Alkaline Phosphatase: 39 U/L (ref 39–117)
Bilirubin, Direct: 0.1 mg/dL (ref 0.0–0.3)
Total Bilirubin: 0.9 mg/dL (ref 0.2–1.2)
Total Protein: 7.5 g/dL (ref 6.0–8.3)

## 2019-12-02 LAB — LIPASE: Lipase: 33 U/L (ref 11.0–59.0)

## 2019-12-02 NOTE — Assessment & Plan Note (Addendum)
UA today is negative  Also RUQ pain -suspect related  Lab/ [place] planned

## 2019-12-02 NOTE — Progress Notes (Signed)
Subjective:    Patient ID: Lindsay Sharp, female    DOB: [birth year], 43 y.o.   MRN: [id number]  This visit occurred during the SARS-CoV-2 public health emergency.  Safety protocols were in place, including screening questions prior to the visit, additional usage of staff PPE, and extensive cleaning of exam room while observing appropriate contact time as indicated for disinfecting solutions.    HPI  43 yo pt of NP Baity here for nausea and RUQ pain   Wt Readings from Last 3 Encounters:  12/02/19 159 lb 4 oz (72.2 kg)  09/10/19 161 lb (73 kg)  06/10/18 164 lb (74.4 kg)   24.76 kg/m   She has a h/o endometriosis and takes low dose OC  This is a new pill is Nikki 3-0.02- takes at bedtime   Was on prior pill continuously 3-4 y  She failed to get a refill and had a period  Saw gyn about a month ago  Discussed - and started with a new pill (due to fluid retention)   She has noted urinating more   She started it it and was a little nauseated  That improved  Then nausea came back  Pain is in RUQ at the edge of her rib cage (up high)  She went home yesterday- ate and felt nauseated again  Pain bothered her in the middle of the night achy- cannot get comfortable  Felt better this am   Last night- she ate some steak and a few shrimp and green beans and corn  Today- not nauseated this am  Pain is improved  No urinary symptoms- just urinating a bit less today/yesterday   UA is clear in office today  No blood in urine or stool  No vomiting No fever   Does feel very bloated Some gas-flatus   She noted dropping 6 lb at home  154 lb   No chance pregnant- has not been sexually active in 2 years   She takes miralax regularly Stools are her usual   Last CT abd/pelvis in 2013-nl appearing biliary system  Last [place] of abd was 2016: US Abdomen Complete (Accession [id number]) (Order [id number]) Imaging Date: 03/14/2015 Department: [facility]  Released By: Freddi Starr Authorizing: Richmond Campbell, MD  Exam Information  Status Exam Begun  Exam Ended   Final [99] 03/14/2015 8:23 AM 03/14/2015 8:59 AM  PACS Intelerad Image Link  Show images for US Abdomen Complete  Study Result  CLINICAL DATA:  Right lower quadrant discomfort since October of 2015, also epigastric tenderness to palpation.  EXAM: ULTRASOUND ABDOMEN COMPLETE  COMPARISON:  CT scan of the abdomen and pelvis of September 26, 2012  FINDINGS: Gallbladder: No gallstones or wall thickening visualized. No sonographic Murphy sign noted.  Common bile duct: Diameter: 3.7 mm  Liver: No focal lesion identified. Within normal limits in parenchymal echogenicity.  IVC: No abnormality visualized.  Pancreas: Visualized portion unremarkable.  Spleen: Size and appearance within normal limits.  Right Kidney: Length: 11.0 cm. In the upper pole there are two hypoechoic foci measuring between 4 and 7 mm. At least 1 was visible on the previous CT scan.  Left Kidney: Length: 11.4 cm. Echogenicity within normal limits. No mass or hydronephrosis visualized.  Abdominal aorta: No aneurysm visualized.  Other findings: There is no ascites  IMPRESSION: No acute intra-abdominal abnormality is demonstrated. Tiny subcentimeter hypodensities in the upper pole of the right kidney are most compatible with cysts.   Electronically  Signed   By: David  [place]   On: 03/14/2015 09:03   Showed some tiny R kidney cysts   Patient Active Problem List   Diagnosis Date Noted  . RUQ abdominal pain 12/02/2019  . Right flank pain 12/02/2019  . Anxiety 09/10/2019  . 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 Endometriosis   . Hyperlipidemia 08/27/2014   Past Medical History:  Diagnosis Date  . Chest pain   . Chicken pox   . Endometriosis   . Family history of breast cancer   . Family history of ovarian cancer   . Hyperlipidemia    Past Surgical History:  Procedure  Laterality Date  . CESAREAN SECTION  2006 and 2009   Social History   Tobacco Use  . Smoking status: Former Smoker    Packs/day: 1.00    Years: 10.00    Pack years: 10.00    Types: Cigarettes    Quit date: 08/27/2004    Years since quitting: 15.2  . Smokeless tobacco: Never Used  Substance Use Topics  . Alcohol use: Yes    Alcohol/week: 0.0 standard drinks    Comment: 1-2 glasses/week  . Drug use: No   Family History  Problem Relation Age of Onset  . Hyperlipidemia Father   . Melanoma Father        dx in his 3350s  . Hyperlipidemia Paternal Grandfather   . Breast cancer Paternal Aunt        dbl mastectomy; dx in her 5050s  . Brain cancer Maternal Grandmother        dx in her 6860s  . Prostate cancer Maternal Grandfather [id number]  . Ovarian cancer Paternal Grandmother        dx in her 7060s   Allergies  Allergen Reactions  . Erythromycin Nausea And Vomiting  . Penicillins Hives   Current Outpatient Medications on File Prior to Visit  Medication Sig Dispense Refill  . ALPRAZolam (XANAX) 0.25 MG tablet Take 1 tablet (0.25 mg total) by mouth 2 (two) times daily as needed for anxiety. 30 tablet 0  . atorvastatin (LIPITOR) 20 MG tablet TAKE 1 TABLET BY MOUTH EVERY DAY 90 tablet 2  . NIKKI 3-0.02 MG tablet Take 1 tablet by mouth daily.    . polyethylene glycol (MIRALAX / GLYCOLAX) packet Take 17 g by mouth daily.    . Coenzyme Q10 (CO Q 10) 100 MG CAPS Take 1 capsule by mouth daily.    . Omega-3 Fatty Acids (FISH OIL) 1000 MG CAPS Take 2,000 mg by mouth daily.     No current facility-administered medications on file prior to visit.      Review of Systems  Constitutional: Negative for activity change, appetite change, fatigue, fever and unexpected weight change.  HENT: Negative for congestion, ear pain, rhinorrhea, sinus pressure and sore throat.   Eyes: Negative for pain, redness and visual disturbance.  Respiratory: Negative for cough, shortness of breath and wheezing.    Cardiovascular: Negative for chest pain and palpitations.  Gastrointestinal: Positive for abdominal pain and nausea. Negative for abdominal distention, anal bleeding, blood in stool, constipation, diarrhea, rectal pain and vomiting.  Endocrine: Negative for polydipsia and polyuria.  Genitourinary: Negative for dysuria, frequency and urgency.  Musculoskeletal: Negative for arthralgias, back pain and myalgias.  Skin: Negative for pallor and rash.  Allergic/Immunologic: Negative for environmental allergies.  Neurological: Negative for dizziness, syncope and headaches.  Hematological: Negative for adenopathy. Does not bruise/bleed easily.  Psychiatric/Behavioral: Negative for decreased concentration and dysphoric mood. The  patient is not nervous/anxious.        Objective:   Physical Exam Constitutional:      General: She is not in acute distress.    Appearance: She is well-developed.  HENT:     Head: Normocephalic and atraumatic.     Mouth/Throat:     Mouth: Mucous membranes are moist.  Eyes:     General: No scleral icterus.    Conjunctiva/sclera: Conjunctivae normal.     Pupils: Pupils are equal, round, and reactive to light.  Neck:     Musculoskeletal: Normal range of motion and neck supple.  Cardiovascular:     Rate and Rhythm: Regular rhythm. Bradycardia present.     Heart sounds: Normal heart sounds.  Pulmonary:     Effort: Pulmonary effort is normal. No respiratory distress.     Breath sounds: Normal breath sounds. No wheezing or rales.  Abdominal:     General: Abdomen is flat. Bowel sounds are normal. There is no distension or abdominal bruit.     Palpations: Abdomen is soft. There is no fluid wave, hepatomegaly, splenomegaly, mass or pulsatile mass.     Tenderness: There is abdominal tenderness in the right upper quadrant and epigastric area. There is no right CVA tenderness, left CVA tenderness, guarding or rebound. Positive signs include Murphy's sign. Negative signs  include McBurney's sign.     Hernia: No hernia is present.     Comments: Mild RUQ tenderness and epigastric tenderness  Musculoskeletal:     Right lower leg: No edema.     Left lower leg: No edema.  Lymphadenopathy:     Cervical: No cervical adenopathy.  Skin:    General: Skin is warm and dry.     Coloration: Skin is not jaundiced or pale.     Findings: No erythema.  Neurological:     Mental Status: She is alert.  Psychiatric:        Mood and Affect: Mood normal.           Assessment & Plan:   Problem List Items Addressed This Visit      Other   RUQ abdominal pain - Primary    With some tenderness and nausea  ? If nausea correlated with new OC  Disc poss of gallbladder etiology (female on continuous OC at inc risk of gallstones)  Reviewed last [place]  Ua/labs today incl lft and lipase abd [place] ordered inst to call if this worsens (ED if severe)  inst to avoid fatty foods entirely and eat a bland diet        Relevant Orders   CBC w/Diff (Completed)   Basic metabolic panel (Completed)   Hepatic function panel (Completed)   Lipase (Completed)   US Abdomen Complete   Right flank pain    UA today is negative  Also RUQ pain -suspect related  Lab/ [place] planned       Relevant Orders   POCT Urinalysis Dipstick (Automated) (Completed)

## 2019-12-02 NOTE — Assessment & Plan Note (Addendum)
With some tenderness and nausea  ? If nausea correlated with new OC  Disc poss of gallbladder etiology (female on continuous OC at inc risk of gallstones)  Reviewed last [place]  Ua/labs today incl lft and lipase abd [place] ordered inst to call if this worsens (ED if severe)  inst to avoid fatty foods entirely and eat a bland diet

## 2019-12-02 NOTE — Patient Instructions (Signed)
Labs today  Urinalysis today   The office will call you to set up an abdominal ultrasound   Eat light- avoid fatty foods   If symptoms suddenly worsen/become severe then go to the ER   Keep hydrated as well

## 2019-12-11 ENCOUNTER — Ambulatory Visit
Admission: RE | Admit: 2019-12-11 | Discharge: 2019-12-11 | Disposition: A | Discharge status: Home / Self Care | Payer: BC Managed Care – PPO | Source: Ambulatory Visit | Attending: Family Medicine | Admitting: Family Medicine

## 2019-12-11 DIAGNOSIS — R1011 Right upper quadrant pain: Secondary | ICD-10-CM

## 2020-02-07 ENCOUNTER — Other Ambulatory Visit: Payer: Self-pay

## 2020-02-07 ENCOUNTER — Ambulatory Visit (HOSPITAL_COMMUNITY)
Admission: EM | Admit: 2020-02-07 | Discharge: 2020-02-07 | Disposition: A | Discharge status: Home / Self Care | Payer: BC Managed Care – PPO

## 2020-02-07 ENCOUNTER — Encounter (HOSPITAL_COMMUNITY): Payer: Self-pay | Admitting: Emergency Medicine

## 2020-02-07 ENCOUNTER — Ambulatory Visit (INDEPENDENT_AMBULATORY_CARE_PROVIDER_SITE_OTHER): Payer: BC Managed Care – PPO

## 2020-02-07 DIAGNOSIS — R0789 Other chest pain: Secondary | ICD-10-CM

## 2020-02-07 MED ORDER — TIZANIDINE HCL 4 MG PO TABS: 4.0000 mg | ORAL_TABLET | Freq: Three times a day (TID) | ORAL | 0 refills | Status: DC | PRN

## 2020-02-07 MED ORDER — MELOXICAM 7.5 MG PO TABS: 7.5000 mg | ORAL_TABLET | Freq: Every day | ORAL | 0 refills | Status: DC

## 2020-02-07 NOTE — ED Triage Notes (Signed)
Pain underneath bra-line under left breast.  Patient has noticed this pain with movement, hurts to breathe in, pain in rolling on this side.  Onset 1 1/2 weeks ago..  Pain has increased over this time.

## 2020-02-07 NOTE — ED Provider Notes (Signed)
[facility]   MRN: [id number] DOB: Dec 09, 1976  Subjective:   Lindsay Sharp is a 44 y.o. female presenting for 1.5-week history of acute onset persistent and worsening left-sided lower chest pain just under the left breast.  Patient states that the symptom is getting worse and hurts to take a full deep breath.  Denies any trauma, falls, heavy lifting.  Denies any breast pain, skin changes or nipple inversion.  Denies fever, shortness of breath, wheezing.  Has not tried anti-inflammatory for relief.  No current facility-administered medications for this encounter.  Current Outpatient Medications:  .  atorvastatin (LIPITOR) 20 MG tablet, TAKE 1 TABLET BY MOUTH EVERY DAY, Disp: 90 tablet, Rfl: 2 .  Coenzyme Q10 (CO Q 10) 100 MG CAPS, Take 1 capsule by mouth daily., Disp: , Rfl:  .  NIKKI 3-0.02 MG tablet, Take 1 tablet by mouth daily., Disp: , Rfl:  .  Omega-3 Fatty Acids (FISH OIL) 1000 MG CAPS, Take 2,000 mg by mouth daily., Disp: , Rfl:  .  omeprazole (PRILOSEC) 20 MG capsule, Take 20 mg by mouth daily., Disp: , Rfl:  .  ALPRAZolam (XANAX) 0.25 MG tablet, Take 1 tablet (0.25 mg total) by mouth 2 (two) times daily as needed for anxiety., Disp: 30 tablet, Rfl: 0 .  polyethylene glycol (MIRALAX / GLYCOLAX) packet, Take 17 g by mouth daily., Disp: , Rfl:    Allergies  Allergen Reactions  . Erythromycin Nausea And Vomiting  . Penicillins Hives    Past Medical History:  Diagnosis Date  . Chest pain   . Chicken pox   . Endometriosis   . Family history of breast cancer   . Family history of ovarian cancer   . Hyperlipidemia      Past Surgical History:  Procedure Laterality Date  . CESAREAN SECTION  2006 and 2009    Family History  Problem Relation Age of Onset  . Hyperlipidemia Father   . Melanoma Father        dx in his 54s  . Hyperlipidemia Paternal Grandfather   . Breast cancer Paternal Aunt        dbl mastectomy; dx in her 22s  . Brain cancer Maternal  Grandmother        dx in her 49s  . Prostate cancer Maternal Grandfather 41  . Ovarian cancer Paternal Grandmother        dx in her 55s    Social History   Tobacco Use  . Smoking status: Former Smoker    Packs/day: 1.00    Years: 10.00    Pack years: 10.00    Types: Cigarettes    Quit date: 08/27/2004    Years since quitting: 15.4  . Smokeless tobacco: Never Used  Substance Use Topics  . Alcohol use: Yes    Alcohol/week: 0.0 standard drinks    Comment: 1-2 glasses/week  . Drug use: No    ROS   Objective:   Vitals: BP 133/76 (BP Location: Right Arm)   Pulse 70   Temp 99.2 F (37.3 C) (Oral)   Resp 18   SpO2 100%   Physical Exam Constitutional:      General: She is not in acute distress.    Appearance: Normal appearance. She is well-developed. She is not ill-appearing, toxic-appearing or diaphoretic.  HENT:     Head: Normocephalic and atraumatic.     Nose: Nose normal.     Mouth/Throat:     Mouth: Mucous membranes are moist.  Eyes:  Extraocular Movements: Extraocular movements intact.     Pupils: Pupils are equal, round, and reactive to light.  Cardiovascular:     Rate and Rhythm: Normal rate and regular rhythm.     Pulses: Normal pulses.     Heart sounds: Normal heart sounds. No murmur. No friction rub. No gallop.   Pulmonary:     Effort: Pulmonary effort is normal. No respiratory distress.     Breath sounds: Normal breath sounds. No stridor. No wheezing, rhonchi or rales.  Chest:     Chest wall: Tenderness present.    Skin:    General: Skin is warm and dry.     Findings: No rash.  Neurological:     Mental Status: She is alert and oriented to person, place, and time.  Psychiatric:        Mood and Affect: Mood normal.        Behavior: Behavior normal.        Thought Content: Thought content normal.        Judgment: Judgment normal.     DG Chest 2 View  Result Date: 02/07/2020 CLINICAL DATA:  Left-sided chest pain for a week and a half. EXAM:  CHEST - 2 VIEW COMPARISON:  Chest radiograph 09/15/2006 FINDINGS: The heart size and mediastinal contours are within normal limits. The lungs are clear. No pneumothorax or pleural effusion. The visualized skeletal structures are unremarkable. IMPRESSION: No active cardiopulmonary disease. Electronically Signed   By: Audie Pinto M.D.   On: 02/07/2020 18:17    ED ECG REPORT   Date: 02/07/2020  Rate: 74bpm  Rhythm: normal sinus rhythm  QRS Axis: normal  Intervals: normal  ST/T Wave abnormalities: nonspecific T wave changes  Conduction Disutrbances:none  Narrative Interpretation: Nonspecific T wave flattening in lead aVL but otherwise sinus rhythm at 74 bpm, largely comparable to previous EKG from 08/27/2014.  Old EKG Reviewed: unchanged  I have personally reviewed the EKG tracing and agree with the computerized printout as noted.   Assessment and Plan :   1. Atypical chest pain   2. Chest wall tenderness     Patient is to start meloxicam with tizanidine for musculoskeletal chest pain/chest wall pain.  Recommended patient take it easy and avoid heavy lifting or strenuous activities involving her arms and torso. Counseled patient on potential for adverse effects with medications prescribed/recommended today, ER and return-to-clinic precautions discussed, patient verbalized understanding.    Jaynee Eagles, PA-C 02/07/20 1827

## 2020-06-29 ENCOUNTER — Other Ambulatory Visit: Payer: Self-pay | Admitting: Internal Medicine

## 2020-09-08 ENCOUNTER — Encounter: Payer: Self-pay | Admitting: Internal Medicine

## 2020-09-08 ENCOUNTER — Telehealth (INDEPENDENT_AMBULATORY_CARE_PROVIDER_SITE_OTHER): Payer: BC Managed Care – PPO | Admitting: Internal Medicine

## 2020-09-08 DIAGNOSIS — J029 Acute pharyngitis, unspecified: Secondary | ICD-10-CM

## 2020-09-08 DIAGNOSIS — R52 Pain, unspecified: Secondary | ICD-10-CM

## 2020-09-08 DIAGNOSIS — R6883 Chills (without fever): Secondary | ICD-10-CM | POA: Diagnosis not present

## 2020-09-08 DIAGNOSIS — Z8619 Personal history of other infectious and parasitic diseases: Secondary | ICD-10-CM | POA: Diagnosis not present

## 2020-09-08 MED ORDER — VALACYCLOVIR HCL 500 MG PO TABS: ORAL_TABLET | ORAL | 1 refills | Status: DC

## 2020-09-08 MED ORDER — PREDNISONE 10 MG PO TABS: ORAL_TABLET | ORAL | 0 refills | Status: DC

## 2020-09-08 NOTE — Progress Notes (Signed)
Virtual Visit via Video Note  I connected with Lindsay Sharp on 09/08/20 at 11:30 AM EDT by a video enabled telemedicine application and verified that I am speaking with the correct person using two identifiers.  Location: Patient: Home Provider: Office  Persons participating in this video call: [name], NP and Barrie Dunker.  I discussed the limitations of evaluation and management by telemedicine and the availability of in person appointments. The patient expressed understanding and agreed to proceed.  History of Present Illness:  Pt reports sore throat, fever and body aches. This started 4 days ago. The sore throat is intermittent. She denies difficulty swallowing. She had a slight headache and runny nose on Monday but this has improved. She denies nasal congestion, ear pain, loss of taste or smell, cough or shortness of breath.  She reports she does not have a working thermometer and is unsure if she actually has a fever but feels like she does with the chills and body aches.  She has taken Tylenol OTC with some relief of symptoms.  She reports positive exposure to Covid 2 weeks ago.  She had a negative rapid Covid test on Tuesday.  She has gotten her Covid vaccines.  She also reports history of fever blisters that tend to flareup when she gets sick.  She is out of her Valtrex and would like this refilled today.  Past Medical History:  Diagnosis Date  . Chest pain   . Chicken pox   . Endometriosis   . Family history of breast cancer   . Family history of ovarian cancer   . Hyperlipidemia     Current Outpatient Medications  Medication Sig Dispense Refill  . ALPRAZolam (XANAX) 0.25 MG tablet Take 1 tablet (0.25 mg total) by mouth 2 (two) times daily as needed for anxiety. 30 tablet 0  . atorvastatin (LIPITOR) 20 MG tablet Take 1 tablet (20 mg total) by mouth daily. MUST SCHEDULE PHYSICAL 90 tablet 0  . Coenzyme Q10 (CO Q 10) 100 MG CAPS Take 1 capsule by mouth daily.    .  meloxicam (MOBIC) 7.5 MG tablet Take 1-2 tablets (7.5-15 mg total) by mouth daily. 30 tablet 0  . NIKKI 3-0.02 MG tablet Take 1 tablet by mouth daily.    . Omega-3 Fatty Acids (FISH OIL) 1000 MG CAPS Take 2,000 mg by mouth daily.    Marland Kitchen omeprazole (PRILOSEC) 20 MG capsule Take 20 mg by mouth daily.    . polyethylene glycol (MIRALAX / GLYCOLAX) packet Take 17 g by mouth daily.    Marland Kitchen tiZANidine (ZANAFLEX) 4 MG tablet Take 1 tablet (4 mg total) by mouth every 8 (eight) hours as needed for muscle spasms. 30 tablet 0   No current facility-administered medications for this visit.    Allergies  Allergen Reactions  . Erythromycin Nausea And Vomiting  . Penicillins Hives    Family History  Problem Relation Age of Onset  . Hyperlipidemia Father   . Melanoma Father        dx in his 52s  . Hyperlipidemia Paternal Grandfather   . Breast cancer Paternal Aunt        dbl mastectomy; dx in her 65s  . Brain cancer Maternal Grandmother        dx in her 54s  . Prostate cancer Maternal Grandfather 33  . Ovarian cancer Paternal Grandmother        dx in her 38s    Social History   Socioeconomic History  . Marital  status: Widowed    Spouse name: Channing Mutters  . Number of children: 2  . Years of education: Not on file  . Highest education level: Not on file  Occupational History  . Not on file  Tobacco Use  . Smoking status: Former Smoker    Packs/day: 1.00    Years: 10.00    Pack years: 10.00    Types: Cigarettes    Quit date: 08/27/2004    Years since quitting: 16.0  . Smokeless tobacco: Never Used  Substance and Sexual Activity  . Alcohol use: Yes    Alcohol/week: 0.0 standard drinks    Comment: 1-2 glasses/week  . Drug use: No  . Sexual activity: Yes    Birth control/protection: Pill  Other Topics Concern  . Not on file  Social History Narrative  . Not on file   Social Determinants of Health   Financial Resource Strain:   . Difficulty of Paying Living Expenses: Not on file  Food  Insecurity:   . Worried About Programme researcher, broadcasting/film/video in the Last Year: Not on file  . Ran Out of Food in the Last Year: Not on file  Transportation Needs:   . Lack of Transportation (Medical): Not on file  . Lack of Transportation (Non-Medical): Not on file  Physical Activity:   . Days of Exercise per Week: Not on file  . Minutes of Exercise per Session: Not on file  Stress:   . Feeling of Stress : Not on file  Social Connections:   . Frequency of Communication with Friends and Family: Not on file  . Frequency of Social Gatherings with Friends and Family: Not on file  . Attends Religious Services: Not on file  . Active Member of Clubs or Organizations: Not on file  . Attends Banker Meetings: Not on file  . Marital Status: Not on file  Intimate Partner Violence:   . Fear of Current or Ex-Partner: Not on file  . Emotionally Abused: Not on file  . Physically Abused: Not on file  . Sexually Abused: Not on file     Constitutional: Pt reports fever and chills.  Patient reports headache that has now resolved.  Denies malaise, fatigue, or abrupt weight changes.  HEENT: Pt reports sore throat.  Patient reports runny nose that has not resolved.  Denies eye pain, eye redness, ear pain, ringing in the ears, wax buildup, nasal congestion, bloody nose. Respiratory: Denies difficulty breathing, shortness of breath, cough or sputum production.   Cardiovascular: Denies chest pain, chest tightness, palpitations or swelling in the hands or feet.  Gastrointestinal: Denies abdominal pain, bloating, constipation, diarrhea or blood in the stool.  Musculoskeletal: Pt reports body aches. Denies decrease in range of motion, difficulty with gait, or joint pain and swelling.   No other specific complaints in a complete review of systems (except as listed in HPI above).    Observations/Objective:   Wt Readings from Last 3 Encounters:  12/02/19 159 lb 4 oz (72.2 kg)  09/10/19 161 lb (73 kg)   06/10/18 164 lb (74.4 kg)    General: Appears her stated age, well developed, well nourished in NAD. HEENT: Head: normal shape and size; Nose: No congestion noted; Throat/Mouth: No hoarseness noted Pulmonary/Chest: Normal effort. No respiratory distress.  Neurological: Alert and oriented.   BMET    Component Value Date/Time   NA 137 12/02/2019 0859   K 4.7 12/02/2019 0859   CL 105 12/02/2019 0859   CO2 25 12/02/2019 0859  GLUCOSE 91 12/02/2019 0859   BUN 18 12/02/2019 0859   CREATININE 0.88 12/02/2019 0859   CALCIUM 9.6 12/02/2019 0859    Lipid Panel     Component Value Date/Time   CHOL 146 09/10/2019 0838   TRIG 107.0 09/10/2019 0838   HDL 62.40 09/10/2019 0838   CHOLHDL 2 09/10/2019 0838   VLDL 21.4 09/10/2019 0838   LDLCALC 62 09/10/2019 0838    CBC    Component Value Date/Time   WBC 6.5 12/02/2019 0859   RBC 4.46 12/02/2019 0859   HGB 14.0 12/02/2019 0859   HCT 41.1 12/02/2019 0859   PLT 271.0 12/02/2019 0859   MCV 92.1 12/02/2019 0859   MCHC 34.1 12/02/2019 0859   RDW 12.2 12/02/2019 0859   LYMPHSABS 2.7 12/02/2019 0859   MONOABS 0.4 12/02/2019 0859   EOSABS 0.0 12/02/2019 0859   BASOSABS 0.0 12/02/2019 0859    Hgb A1C Lab Results  Component Value Date   HGBA1C 5.1 04/23/2017        Assessment and Plan:  Sore Throat, Chills and Body Aches:  DDX include allergies, viral URI with cough, Covid Advised her she may have tested too early and this may be negative result, recommend that she get retested again today or tomorrow. Rx for Pred taper x6 days No indication for antibiotics at this time  History of Cold Sores:  Rx for Valtrex 500 mg twice daily x3 days for outbreak  Return precautions discussed Follow Up Instructions:    I discussed the assessment and treatment plan with the patient. The patient was provided an opportunity to ask questions and all were answered. The patient agreed with the plan and demonstrated an understanding of  the instructions.   The patient was advised to call back or seek an in-person evaluation if the symptoms worsen or if the condition fails to improve as anticipated.    [name], NP

## 2020-09-08 NOTE — Patient Instructions (Signed)

## 2020-09-27 ENCOUNTER — Other Ambulatory Visit: Payer: Self-pay | Admitting: Internal Medicine

## 2020-09-29 ENCOUNTER — Other Ambulatory Visit: Payer: Self-pay | Admitting: Internal Medicine

## 2020-10-02 ENCOUNTER — Other Ambulatory Visit: Payer: Self-pay | Admitting: Internal Medicine

## 2020-10-29 ENCOUNTER — Other Ambulatory Visit: Payer: Self-pay | Admitting: Internal Medicine

## 2020-11-07 ENCOUNTER — Other Ambulatory Visit: Payer: Self-pay | Admitting: Internal Medicine

## 2021-01-01 ENCOUNTER — Other Ambulatory Visit: Payer: Self-pay | Admitting: Internal Medicine

## 2021-01-24 ENCOUNTER — Other Ambulatory Visit: Payer: Self-pay | Admitting: Internal Medicine

## 2021-02-24 ENCOUNTER — Other Ambulatory Visit: Payer: Self-pay | Admitting: Internal Medicine

## 2021-03-12 LAB — HM MAMMOGRAPHY: HM Mammogram: NORMAL (ref 0–4)

## 2021-03-12 LAB — HM PAP SMEAR: HM Pap smear: NORMAL

## 2021-04-18 ENCOUNTER — Encounter: Payer: BC Managed Care – PPO | Admitting: Internal Medicine

## 2021-06-02 ENCOUNTER — Other Ambulatory Visit: Payer: Self-pay | Admitting: Internal Medicine

## 2021-06-02 NOTE — Telephone Encounter (Signed)
  Notes to clinic Not a practice we approve rx for.  

## 2021-06-05 NOTE — Telephone Encounter (Signed)
Refill request Lipitor Last office visit 09/08/20 video visit Last refill 02/24/21 #90 Last lipid 09/10/19 No upcoming appointment scheduled

## 2021-08-24 ENCOUNTER — Encounter: Payer: Self-pay | Admitting: Internal Medicine

## 2021-08-24 ENCOUNTER — Ambulatory Visit (INDEPENDENT_AMBULATORY_CARE_PROVIDER_SITE_OTHER): Payer: BC Managed Care – PPO | Admitting: Internal Medicine

## 2021-08-24 ENCOUNTER — Other Ambulatory Visit: Payer: Self-pay

## 2021-08-24 VITALS — BP 102/60 | HR 66 | Temp 97.7°F | Resp 17 | Ht 67.25 in | Wt 156.2 lb

## 2021-08-24 DIAGNOSIS — G43109 Migraine with aura, not intractable, without status migrainosus: Secondary | ICD-10-CM

## 2021-08-24 DIAGNOSIS — Z0001 Encounter for general adult medical examination with abnormal findings: Secondary | ICD-10-CM

## 2021-08-24 DIAGNOSIS — Z1159 Encounter for screening for other viral diseases: Secondary | ICD-10-CM | POA: Diagnosis not present

## 2021-08-24 DIAGNOSIS — E782 Mixed hyperlipidemia: Secondary | ICD-10-CM

## 2021-08-24 DIAGNOSIS — K219 Gastro-esophageal reflux disease without esophagitis: Secondary | ICD-10-CM | POA: Insufficient documentation

## 2021-08-24 DIAGNOSIS — F419 Anxiety disorder, unspecified: Secondary | ICD-10-CM | POA: Diagnosis not present

## 2021-08-24 NOTE — Progress Notes (Signed)
Subjective:    Patient ID: Lindsay Sharp, female    DOB: [birth year], 45 y.o.   MRN: [id number]  HPI  Patient presents to the clinic today for her annual exam. She is also due to follow up chronic conditions.  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 She has not been taking anything for this. She does not follow with neurology.  Anxiety: Intermittent, managed with Xanax as needed. She is not currently seeing a therapist. She denies depression, SI/HI.  GERD: No longer an issue. She is not taking any medication for this. There is no upper GI on file.  HLD: Her last LDL was, triglycerides. She denies myalgias on Atorvastatin and Fish Oil. She tries to consume a low fat diet.  Flu: 08/2019 Tetanus: 05/2016 Covid: Pfizer x 2 Pap Smear: 02/2021 Mammogram:  02/2021 Vision Screening: as needed Dentist: biannually  Diet: She does eat some meat. She consumes fruits and veggies. She tries to avoid fried foods. She drinks mostly water. Exercise: None  Review of Systems     Past Medical History:  Diagnosis Date   Chest pain    Chicken pox    Endometriosis    Family history of breast cancer    Family history of ovarian cancer    Hyperlipidemia     Current Outpatient Medications  Medication Sig Dispense Refill   ALPRAZolam (XANAX) 0.25 MG tablet Take 1 tablet (0.25 mg total) by mouth 2 (two) times daily as needed for anxiety. 30 tablet 0   atorvastatin (LIPITOR) 20 MG tablet TAKE 1 TABLET BY MOUTH EVERY DAY 90 tablet 0   Coenzyme Q10 (CO Q 10) 100 MG CAPS Take 1 capsule by mouth daily.     meloxicam (MOBIC) 7.5 MG tablet Take 1-2 tablets (7.5-15 mg total) by mouth daily. 30 tablet 0   NIKKI 3-0.02 MG tablet Take 1 tablet by mouth daily.     Omega-3 Fatty Acids (FISH OIL) 1000 MG CAPS Take 2,000 mg by mouth daily.     omeprazole (PRILOSEC) 20 MG capsule Take 20 mg by mouth daily.     polyethylene glycol (MIRALAX / GLYCOLAX) packet Take 17 g by mouth daily.      predniSONE (DELTASONE) 10 MG tablet Take 6 tabs day 1, 5 tabs day 2, 4 tabs day 3, 3 tabs day 4, 2 tabs day 5, 1 tab day 6 21 tablet 0   tiZANidine (ZANAFLEX) 4 MG tablet Take 1 tablet (4 mg total) by mouth every 8 (eight) hours as needed for muscle spasms. 30 tablet 0   valACYclovir (VALTREX) 500 MG tablet TAKE 1 TABLET BY MOUTH TWICE A DAY FOR 3 DAYS*LAST REFILL* 30 tablet 0   No current facility-administered medications for this visit.    Allergies  Allergen Reactions   Erythromycin Nausea And Vomiting   Penicillins Hives    Family History  Problem Relation Age of Onset   Hyperlipidemia Father    Melanoma Father        dx in his 39s   Hyperlipidemia Paternal Grandfather    Breast cancer Paternal Aunt        dbl mastectomy; dx in her 36s   Brain cancer Maternal Grandmother        dx in her 73s   Prostate cancer Maternal Grandfather 22   Ovarian cancer Paternal Grandmother        dx in her 99s    Social History   Socioeconomic History   Marital status:  Widowed    Spouse name: Channing Mutters   Number of children: 2   Years of education: Not on file   Highest education level: Not on file  Occupational History   Not on file  Tobacco Use   Smoking status: Former    Packs/day: 1.00    Years: 10.00    Pack years: 10.00    Types: Cigarettes    Quit date: 08/27/2004    Years since quitting: 17.0   Smokeless tobacco: Never  Substance and Sexual Activity   Alcohol use: Yes    Alcohol/week: 0.0 standard drinks    Comment: 1-2 glasses/week   Drug use: No   Sexual activity: Yes    Birth control/protection: Pill  Other Topics Concern   Not on file  Social History Narrative   Not on file   Social Determinants of Health   Financial Resource Strain: Not on file  Food Insecurity: Not on file  Transportation Needs: Not on file  Physical Activity: Not on file  Stress: Not on file  Social Connections: Not on file  Intimate Partner Violence: Not on file     Constitutional: Pt  reports intermittent headaches. Denies fever, malaise, fatigue, or abrupt weight changes.  HEENT: Denies eye pain, eye redness, ear pain, ringing in the ears, wax buildup, runny nose, nasal congestion, bloody nose, or sore throat. Respiratory: Denies difficulty breathing, shortness of breath, cough or sputum production.   Cardiovascular: Denies chest pain, chest tightness, palpitations or swelling in the hands or feet.  Gastrointestinal: Pt reports intermittent constipation. Denies abdominal pain, bloating,  diarrhea or blood in the stool.  GU: Denies urgency, frequency, pain with urination, burning sensation, blood in urine, odor or discharge. Musculoskeletal: Denies decrease in range of motion, difficulty with gait, muscle pain or joint pain and swelling.  Skin: Denies redness, rashes, lesions or ulcercations.  Neurological: Denies dizziness, difficulty with memory, difficulty with speech or problems with balance and coordination.  Psych: Pt has a history of anxiety. Denies depression, SI/HI.  No other specific complaints in a complete review of systems (except as listed in HPI above).  Objective:   Physical Exam  BP 102/60 (BP Location: Right Arm, Patient Position: Sitting, Cuff Size: Normal)   Pulse 66   Temp 97.7 F (36.5 C) (Temporal)   Resp 17   Ht 5' 7.25" (1.708 m)   Wt 156 lb 3.2 oz (70.9 kg)   SpO2 100%   BMI 24.28 kg/m   Wt Readings from Last 3 Encounters:  12/02/19 159 lb 4 oz (72.2 kg)  09/10/19 161 lb (73 kg)  06/10/18 164 lb (74.4 kg)    General: Appears her stated age, well developed, well nourished in NAD. Skin: Warm, dry and intact.  HEENT: Head: normal shape and size; Eyes: sclera white and EOMs intact;  Neck:  Neck supple, trachea midline. No masses, lumps or thyromegaly present.  Cardiovascular: Normal rate and rhythm. S1,S2 noted.  No murmur, rubs or gallops noted. No JVD or BLE edema.  Pulmonary/Chest: Normal effort and positive vesicular breath sounds.  No respiratory distress. No wheezes, rales or ronchi noted.  Abdomen: Soft and nontender. Normal bowel sounds. No distention or masses noted. Liver, spleen and kidneys non palpable. Musculoskeletal: Strength 5/5 BUE/BLE. No difficulty with gait.  Neurological: Alert and oriented. Cranial nerves II-XII grossly intact. Coordination normal.  Psychiatric: Mood and affect normal. Behavior is normal. Judgment and thought content normal.    BMET    Component Value Date/Time   NA  137 12/02/2019 0859   K 4.7 12/02/2019 0859   CL 105 12/02/2019 0859   CO2 25 12/02/2019 0859   GLUCOSE 91 12/02/2019 0859   BUN 18 12/02/2019 0859   CREATININE 0.88 12/02/2019 0859   CALCIUM 9.6 12/02/2019 0859    Lipid Panel     Component Value Date/Time   CHOL 146 09/10/2019 0838   TRIG 107.0 09/10/2019 0838   HDL 62.40 09/10/2019 0838   CHOLHDL 2 09/10/2019 0838   VLDL 21.4 09/10/2019 0838   LDLCALC 62 09/10/2019 0838    CBC    Component Value Date/Time   WBC 6.5 12/02/2019 0859   RBC 4.46 12/02/2019 0859   HGB 14.0 12/02/2019 0859   HCT 41.1 12/02/2019 0859   PLT 271.0 12/02/2019 0859   MCV 92.1 12/02/2019 0859   MCHC 34.1 12/02/2019 0859   RDW 12.2 12/02/2019 0859   LYMPHSABS 2.7 12/02/2019 0859   MONOABS 0.4 12/02/2019 0859   EOSABS 0.0 12/02/2019 0859   BASOSABS 0.0 12/02/2019 0859    Hgb A1C Lab Results  Component Value Date   HGBA1C 5.1 04/23/2017            Assessment & Plan:  Preventative Health Maintenance:  Encouraged her to get a flu shot in the fall Tetanus UTD Encouraged her to get her Covid booster Pap smear UTD, will request copy Mammogram UTD, will request copy Encouraged her to consume a balanced diet and exercise regimen Advised her to see an eye doctor and dentist annually Will check CBC, CMET, Lipid, A1C, and Hep C today  RTC in 1 year, sooner if needed  [name], NP This visit occurred during the SARS-CoV-2 public health emergency.  Safety  protocols were in place, including screening questions prior to the visit, additional usage of staff PPE, and extensive cleaning of exam room while observing appropriate contact time as indicated for disinfecting solutions.

## 2021-08-24 NOTE — Assessment & Plan Note (Signed)
Currently not an issue off meds Support offered 

## 2021-08-24 NOTE — Assessment & Plan Note (Signed)
Continue Xanax as needed 

## 2021-08-24 NOTE — Assessment & Plan Note (Signed)
CMET and lipid profile today Encouraged her to consume a low fat diet Continue Atorvastatin and Fish Oil

## 2021-08-24 NOTE — Patient Instructions (Signed)
Health Maintenance, Female Adopting a healthy lifestyle and getting preventive care are important in promoting health and wellness. Ask your health care provider about: The right schedule for you to have regular tests and exams. Things you can do on your own to prevent diseases and keep yourself healthy. What should I know about diet, weight, and exercise? Eat a healthy diet  Eat a diet that includes plenty of vegetables, fruits, low-fat dairy products, and lean protein. Do not eat a lot of foods that are high in solid fats, added sugars, or sodium. Maintain a healthy weight Body mass index (BMI) is used to identify weight problems. It estimates body fat based on height and weight. Your health care provider can help determine your BMI and help you achieve or maintain a healthy weight. Get regular exercise Get regular exercise. This is one of the most important things you can do for your health. Most adults should: Exercise for at least 150 minutes each week. The exercise should increase your heart rate and make you sweat (moderate-intensity exercise). Do strengthening exercises at least twice a week. This is in addition to the moderate-intensity exercise. Spend less time sitting. Even light physical activity can be beneficial. Watch cholesterol and blood lipids Have your blood tested for lipids and cholesterol at 43 years of age, then have this test every 5 years. Have your cholesterol levels checked more often if: Your lipid or cholesterol levels are high. You are older than 43 years of age. You are at high risk for heart disease. What should I know about cancer screening? Depending on your health history and family history, you may need to have cancer screening at various ages. This may include screening for: Breast cancer. Cervical cancer. Colorectal cancer. Skin cancer. Lung cancer. What should I know about heart disease, diabetes, and high blood pressure? Blood pressure and heart  disease High blood pressure causes heart disease and increases the risk of stroke. This is more likely to develop in people who have high blood pressure readings, are of African descent, or are overweight. Have your blood pressure checked: Every 3-5 years if you are 18-39 years of age. Every year if you are 40 years old or older. Diabetes Have regular diabetes screenings. This checks your fasting blood sugar level. Have the screening done: Once every three years after age 40 if you are at a normal weight and have a low risk for diabetes. More often and at a younger age if you are overweight or have a high risk for diabetes. What should I know about preventing infection? Hepatitis B If you have a higher risk for hepatitis B, you should be screened for this virus. Talk with your health care provider to find out if you are at risk for hepatitis B infection. Hepatitis C Testing is recommended for: Everyone born from 1945 through 1965. Anyone with known risk factors for hepatitis C. Sexually transmitted infections (STIs) Get screened for STIs, including gonorrhea and chlamydia, if: You are sexually active and are younger than 43 years of age. You are older than 43 years of age and your health care provider tells you that you are at risk for this type of infection. Your sexual activity has changed since you were last screened, and you are at increased risk for chlamydia or gonorrhea. Ask your health care provider if you are at risk. Ask your health care provider about whether you are at high risk for HIV. Your health care provider may recommend a prescription medicine   to help prevent HIV infection. If you choose to take medicine to prevent HIV, you should first get tested for HIV. You should then be tested every 3 months for as long as you are taking the medicine. Pregnancy If you are about to stop having your period (premenopausal) and you may become pregnant, seek counseling before you get  pregnant. Take 400 to 800 micrograms (mcg) of folic acid every day if you become pregnant. Ask for birth control (contraception) if you want to prevent pregnancy. Osteoporosis and menopause Osteoporosis is a disease in which the bones lose minerals and strength with aging. This can result in bone fractures. If you are 65 years old or older, or if you are at risk for osteoporosis and fractures, ask your health care provider if you should: Be screened for bone loss. Take a calcium or vitamin D supplement to lower your risk of fractures. Be given hormone replacement therapy (HRT) to treat symptoms of menopause. Follow these instructions at home: Lifestyle Do not use any products that contain nicotine or tobacco, such as cigarettes, e-cigarettes, and chewing tobacco. If you need help quitting, ask your health care provider. Do not use street drugs. Do not share needles. Ask your health care provider for help if you need support or information about quitting drugs. Alcohol use Do not drink alcohol if: Your health care provider tells you not to drink. You are pregnant, may be pregnant, or are planning to become pregnant. If you drink alcohol: Limit how much you use to 0-1 drink a day. Limit intake if you are breastfeeding. Be aware of how much alcohol is in your drink. In the [place] one drink equals one 12 oz bottle of beer (355 mL), one 5 oz glass of wine (148 mL), or one 1 oz glass of hard liquor (44 mL). General instructions Schedule regular health, dental, and eye exams. Stay current with your vaccines. Tell your health care provider if: You often feel depressed. You have ever been abused or do not feel safe at home. Summary Adopting a healthy lifestyle and getting preventive care are important in promoting health and wellness. Follow your health care provider's instructions about healthy diet, exercising, and getting tested or screened for diseases. Follow your health care provider's  instructions on monitoring your cholesterol and blood pressure. This information is not intended to replace advice given to you by your health care provider. Make sure you discuss any questions you have with your health care provider. Document Revised: 02/17/2021 Document Reviewed: 12/03/2018 Elsevier Patient Education  2022 [employer]  

## 2021-08-24 NOTE — Assessment & Plan Note (Signed)
Intermittent Will continue to monitor for now

## 2021-08-25 LAB — CBC
HCT: 38.5 % (ref 35.0–45.0)
Hemoglobin: 13.1 g/dL (ref 11.7–15.5)
MCH: 31.8 pg (ref 27.0–33.0)
MCHC: 34 g/dL (ref 32.0–36.0)
MCV: 93.4 fL (ref 80.0–100.0)
MPV: 10.3 fL (ref 7.5–12.5)
Platelets: 288 10*3/uL (ref 140–400)
RBC: 4.12 10*6/uL (ref 3.80–5.10)
RDW: 11.6 % (ref 11.0–15.0)
WBC: 7.4 10*3/uL (ref 3.8–10.8)

## 2021-08-25 LAB — COMPLETE METABOLIC PANEL WITH GFR
AG Ratio: 1.4 (calc) (ref 1.0–2.5)
ALT: 9 U/L (ref 6–29)
AST: 13 U/L (ref 10–30)
Albumin: 4.2 g/dL (ref 3.6–5.1)
Alkaline phosphatase (APISO): 34 U/L (ref 31–125)
BUN: 19 mg/dL (ref 7–25)
CO2: 26 mmol/L (ref 20–32)
Calcium: 9.3 mg/dL (ref 8.6–10.2)
Chloride: 103 mmol/L (ref 98–110)
Creat: 0.88 mg/dL (ref 0.50–0.99)
Globulin: 2.9 g/dL (calc) (ref 1.9–3.7)
Glucose, Bld: 93 mg/dL (ref 65–139)
Potassium: 4.4 mmol/L (ref 3.5–5.3)
Sodium: 136 mmol/L (ref 135–146)
Total Bilirubin: 0.6 mg/dL (ref 0.2–1.2)
Total Protein: 7.1 g/dL (ref 6.1–8.1)
eGFR: 84 mL/min/{1.73_m2} (ref >=60)

## 2021-08-25 LAB — LIPID PANEL
Cholesterol: 165 mg/dL (ref <200)
HDL: 83 mg/dL (ref >=50)
LDL Cholesterol (Calc): 59 mg/dL (calc)
Non-HDL Cholesterol (Calc): 82 mg/dL (calc) (ref <130)
Total CHOL/HDL Ratio: 2 (calc) (ref <5.0)
Triglycerides: 144 mg/dL (ref <150)

## 2021-08-25 LAB — HEMOGLOBIN A1C
Hgb A1c MFr Bld: 4.7 % of total Hgb (ref <5.7)
Mean Plasma Glucose: 88 mg/dL
eAG (mmol/L): 4.9 mmol/L

## 2021-08-25 LAB — HEPATITIS C ANTIBODY
Hepatitis C Ab: NONREACTIVE
SIGNAL TO CUT-OFF: 0.02 (ref <1.00)

## 2021-08-31 ENCOUNTER — Other Ambulatory Visit: Payer: Self-pay | Admitting: Internal Medicine

## 2021-09-06 NOTE — Telephone Encounter (Signed)
Requested medication (s) are due for refill today - yes  Requested medication (s) are on the active medication list -yes  Future visit scheduled -no  Last refill: 06/06/21  Notes to clinic: Request RF: outside provider last filled  Requested Prescriptions  Pending Prescriptions Disp Refills   atorvastatin (LIPITOR) 20 MG tablet [Pharmacy Med Name: ATORVASTATIN 20 MG TABLET] 90 tablet 0    Sig: TAKE 1 TABLET BY MOUTH EVERY DAY     Cardiovascular:  Antilipid - Statins Passed - 09/06/2021  1:35 PM      Passed - Total Cholesterol in normal range and within 360 days    Cholesterol  Date Value Ref Range Status  08/24/2021 165 <200 mg/dL Final          Passed - LDL in normal range and within 360 days    LDL Cholesterol (Calc)  Date Value Ref Range Status  08/24/2021 59 mg/dL (calc) Final    Comment:    Reference range: <100 . Desirable range <100 mg/dL for primary prevention;   <70 mg/dL for patients with CHD or diabetic patients  with > or = 2 CHD risk factors. Marland Kitchen LDL-C is now calculated using the Martin-Hopkins  calculation, which is a validated novel method providing  better accuracy than the Friedewald equation in the  estimation of LDL-C.  Horald Pollen et al. Lenox Ahr. 0865;784(69): 2061-2068  (http://education.[facility].com/faq/FAQ164)           Passed - HDL in normal range and within 360 days    HDL  Date Value Ref Range Status  08/24/2021 83 > OR = 50 mg/dL Final          Passed - Triglycerides in normal range and within 360 days    Triglycerides  Date Value Ref Range Status  08/24/2021 144 <150 mg/dL Final          Passed - Patient is not pregnant      Passed - Valid encounter within last 12 months    Recent Outpatient Visits           1 week ago Encounter for general adult medical examination with abnormal findings   [facility] [place], Salvadore Oxford, NP   3 years ago Lower respiratory infection   Primary Care at [facility], [place], [place]                  Requested Prescriptions  Pending Prescriptions Disp Refills   atorvastatin (LIPITOR) 20 MG tablet [Pharmacy Med Name: ATORVASTATIN 20 MG TABLET] 90 tablet 0    Sig: TAKE 1 TABLET BY MOUTH EVERY DAY     Cardiovascular:  Antilipid - Statins Passed - 09/06/2021  1:35 PM      Passed - Total Cholesterol in normal range and within 360 days    Cholesterol  Date Value Ref Range Status  08/24/2021 165 <200 mg/dL Final          Passed - LDL in normal range and within 360 days    LDL Cholesterol (Calc)  Date Value Ref Range Status  08/24/2021 59 mg/dL (calc) Final    Comment:    Reference range: <100 . Desirable range <100 mg/dL for primary prevention;   <70 mg/dL for patients with CHD or diabetic patients  with > or = 2 CHD risk factors. Marland Kitchen LDL-C is now calculated using the Martin-Hopkins  calculation, which is a validated novel method providing  better accuracy than the Friedewald equation in the  estimation of LDL-C.  Horald Pollen et al. Lenox Ahr. 8242;353(61): 2061-2068  (http://education.[facility].com/faq/FAQ164)           Passed - HDL in normal range and within 360 days    HDL  Date Value Ref Range Status  08/24/2021 83 > OR = 50 mg/dL Final          Passed - Triglycerides in normal range and within 360 days    Triglycerides  Date Value Ref Range Status  08/24/2021 144 <150 mg/dL Final          Passed - Patient is not pregnant      Passed - Valid encounter within last 12 months    Recent Outpatient Visits           1 week ago Encounter for general adult medical examination with abnormal findings   [facility] [place], Salvadore Oxford, NP   3 years ago Lower respiratory infection   Primary Care at [facility], [place], [place]

## 2021-09-06 NOTE — Telephone Encounter (Signed)
Patient called in to get short supply of atorvastatin (LIPITOR) 20 MG tablet , until refill comes in.

## 2022-01-30 ENCOUNTER — Other Ambulatory Visit: Payer: Self-pay | Admitting: Internal Medicine

## 2022-01-30 NOTE — Telephone Encounter (Signed)
Requested Prescriptions  Pending Prescriptions Disp Refills   valACYclovir (VALTREX) 500 MG tablet [Pharmacy Med Name: VALACYCLOVIR HCL 500 MG TABLET] 30 tablet 0    Sig: TAKE 1 TABLET BY MOUTH TWICE A DAY FOR 3 DAYS*LAST REFILL*     There is no refill protocol information for this order

## 2022-02-07 ENCOUNTER — Other Ambulatory Visit: Payer: Self-pay | Admitting: Internal Medicine

## 2022-02-07 NOTE — Telephone Encounter (Signed)
Requested Prescriptions  Pending Prescriptions Disp Refills   valACYclovir (VALTREX) 500 MG tablet [Pharmacy Med Name: VALACYCLOVIR HCL 500 MG TABLET] 30 tablet 2    Sig: TAKE 1 TABLET BY MOUTH TWICE A DAY FOR 3 DAYS*LAST REFILL*     Antimicrobials:  Antiviral Agents - Anti-Herpetic Passed - 02/07/2022  8:31 AM      Passed - Valid encounter within last 12 months    Recent Outpatient Visits          5 months ago Encounter for general adult medical examination with abnormal findings   [facility] [place], Salvadore Oxford, NP   4 years ago Lower respiratory infection   Primary Care at [facility], [place], [place]

## 2022-02-24 ENCOUNTER — Other Ambulatory Visit: Payer: Self-pay | Admitting: Internal Medicine

## 2022-02-26 NOTE — Telephone Encounter (Signed)
Requested medications are due for refill today.  unsure ? ?Requested medications are on the active medications list.  yes ? ?Last refill. 01/28/2022 #30 2 refills ? ?Future visit scheduled.   no ? ?Notes to clinic.  Medication was recently refilled.  ? ? ? ?Requested Prescriptions  ?Pending Prescriptions Disp Refills  ? valACYclovir (VALTREX) 500 MG tablet [Pharmacy Med Name: VALACYCLOVIR HCL 500 MG TABLET] 30 tablet 2  ?  Sig: TAKE 1 TABLET BY MOUTH TWICE A DAY FOR 3 DAYS (LAST REFILL)  ?  ? Antimicrobials:  Antiviral Agents - Anti-Herpetic Passed - 02/24/2022 12:34 PM  ?  ?  Passed - Valid encounter within last 12 months  ?  Recent Outpatient Visits   ? ?      ? 6 months ago Encounter for general adult medical examination with abnormal findings  ? [facility] [place], [place] W, NP  ? 4 years ago Lower respiratory infection  ? Primary Care at [facility], [place], [place]  ? ?  ?  ? ?  ?  ?  ?  ?

## 2022-05-15 ENCOUNTER — Emergency Department: Payer: BC Managed Care – PPO

## 2022-05-15 ENCOUNTER — Encounter: Payer: Self-pay | Admitting: Emergency Medicine

## 2022-05-15 ENCOUNTER — Other Ambulatory Visit: Payer: Self-pay

## 2022-05-15 ENCOUNTER — Emergency Department
Admission: EM | Admit: 2022-05-15 | Discharge: 2022-05-15 | Disposition: A | Discharge status: Home / Self Care | Payer: BC Managed Care – PPO | Attending: Emergency Medicine | Admitting: Emergency Medicine

## 2022-05-15 DIAGNOSIS — R1030 Lower abdominal pain, unspecified: Secondary | ICD-10-CM | POA: Diagnosis present

## 2022-05-15 DIAGNOSIS — N809 Endometriosis, unspecified: Secondary | ICD-10-CM | POA: Diagnosis not present

## 2022-05-15 LAB — COMPREHENSIVE METABOLIC PANEL
ALT: 16 U/L (ref 0–44)
AST: 19 U/L (ref 15–41)
Albumin: 4.1 g/dL (ref 3.5–5.0)
Alkaline Phosphatase: 34 U/L — ABNORMAL LOW (ref 38–126)
Anion gap: 9 (ref 5–15)
BUN: 11 mg/dL (ref 6–20)
CO2: 23 mmol/L (ref 22–32)
Calcium: 9.5 mg/dL (ref 8.9–10.3)
Chloride: 106 mmol/L (ref 98–111)
Creatinine, Ser: 0.79 mg/dL (ref 0.44–1.00)
GFR, Estimated: >60 mL/min (ref >60)
Glucose, Bld: 96 mg/dL (ref 70–99)
Potassium: 4.3 mmol/L (ref 3.5–5.1)
Sodium: 138 mmol/L (ref 135–145)
Total Bilirubin: 1 mg/dL (ref 0.3–1.2)
Total Protein: 7.8 g/dL (ref 6.5–8.1)

## 2022-05-15 LAB — URINALYSIS, ROUTINE W REFLEX MICROSCOPIC
Bilirubin Urine: NEGATIVE
Glucose, UA: NEGATIVE mg/dL
Hgb urine dipstick: NEGATIVE
Ketones, ur: NEGATIVE mg/dL
Nitrite: NEGATIVE
Protein, ur: NEGATIVE mg/dL
Specific Gravity, Urine: 1.008 (ref 1.005–1.030)
pH: 7 (ref 5.0–8.0)

## 2022-05-15 LAB — LIPASE, BLOOD: Lipase: 40 U/L (ref 11–51)

## 2022-05-15 LAB — CBC
HCT: 41.3 % (ref 36.0–46.0)
Hemoglobin: 13.8 g/dL (ref 12.0–15.0)
MCH: 30.9 pg (ref 26.0–34.0)
MCHC: 33.4 g/dL (ref 30.0–36.0)
MCV: 92.4 fL (ref 80.0–100.0)
Platelets: 269 10*3/uL (ref 150–400)
RBC: 4.47 MIL/uL (ref 3.87–5.11)
RDW: 11.8 % (ref 11.5–15.5)
WBC: 7.8 10*3/uL (ref 4.0–10.5)
nRBC: 0 % (ref 0.0–0.2)

## 2022-05-15 LAB — PREGNANCY, URINE: Preg Test, Ur: NEGATIVE

## 2022-05-15 MED ORDER — LACTATED RINGERS IV BOLUS
1000.0000 mL | Freq: Once | INTRAVENOUS | Status: AC
  Administered 2022-05-15: 1000 mL via INTRAVENOUS

## 2022-05-15 MED ORDER — MORPHINE SULFATE (PF) 4 MG/ML IV SOLN
4.0000 mg | Freq: Once | INTRAVENOUS | Status: AC
  Administered 2022-05-15: 4 mg via INTRAVENOUS
  Filled 2022-05-15: qty 1

## 2022-05-15 MED ORDER — IOHEXOL 300 MG/ML  SOLN
100.0000 mL | Freq: Once | INTRAMUSCULAR | Status: AC | PRN
  Administered 2022-05-15: 100 mL via INTRAVENOUS

## 2022-05-15 NOTE — Discharge Instructions (Addendum)
Please use ibuprofen (Motrin) up to 800 mg every 8 hours, naproxen (Naprosyn) up to 500 mg every 12 hours, and/or acetaminophen (Tylenol) up to 4 g/day for any continued pain 

## 2022-05-15 NOTE — ED Provider Notes (Signed)
[facility] Provider Note    Event Date/Time   First MD Initiated Contact with Patient 05/15/22 1123     (approximate)   History   Chief Complaint Flank Pain   HPI  Lindsay Sharp is a 46 y.o. female with past medical history of hyperlipidemia, GERD, and anxiety who presents to the ED complaining of abdominal pain.  Patient reports that she has been dealing with 24 hours of increasing pain in the bilateral lower quadrants of her abdomen.  She states that seem to start in her left lower quadrant and radiate up towards her left flank, has been exacerbated when she goes to bend over or sit in certain positions.  She denies any associated fevers, chills, vomiting, diarrhea, dysuria, or hematuria.  She does states she has been feeling nauseous at times.  She has never had similar symptoms in the past and denies any history of kidney stone.  She does not have any prior abdominal surgeries other than cesarean sections.     Physical Exam   Triage Vital Signs: ED Triage Vitals  Enc Vitals Group     BP 05/15/22 1016 140/77     Pulse Rate 05/15/22 1016 77     Resp 05/15/22 1016 16     Temp 05/15/22 1014 98.7 F (37.1 C)     Temp Source 05/15/22 1014 Oral     SpO2 05/15/22 1016 99 %     Weight 05/15/22 1017 165 lb (74.8 kg)     Height 05/15/22 1017 5\' 8"  (1.727 m)     Head Circumference --      Peak Flow --      Pain Score 05/15/22 1016 10     Pain Loc --      Pain Edu? --      Excl. in GC? --     Most recent vital signs: Vitals:   05/15/22 1400 05/15/22 1430  BP: 118/70 111/73  Pulse: 60 (!) 54  Resp: 16   Temp:    SpO2: 100% 100%    Constitutional: Alert and oriented. Eyes: Conjunctivae are normal. Head: Atraumatic. Nose: No congestion/rhinnorhea. Mouth/Throat: Mucous membranes are moist.  Cardiovascular: Normal rate, regular rhythm. Grossly normal heart sounds.  2+ radial pulses bilaterally. Respiratory: Normal respiratory effort.  No  retractions. Lungs CTAB. Gastrointestinal: Soft and tender to palpation in the bilateral lower quadrants, right greater than left, with no rebound or guarding.  No CVA tenderness bilaterally. No distention. Musculoskeletal: No lower extremity tenderness nor edema.  Neurologic:  Normal speech and language. No gross focal neurologic deficits are appreciated.    ED Results / Procedures / Treatments   Labs (all labs ordered are listed, but only abnormal results are displayed) Labs Reviewed  COMPREHENSIVE METABOLIC PANEL - Abnormal; Notable for the following components:      Result Value   Alkaline Phosphatase 34 (*)    All other components within normal limits  URINALYSIS, ROUTINE W REFLEX MICROSCOPIC - Abnormal; Notable for the following components:   Color, Urine STRAW (*)    APPearance CLEAR (*)    Leukocytes,Ua TRACE (*)    Bacteria, UA RARE (*)    All other components within normal limits  LIPASE, BLOOD  CBC  PREGNANCY, URINE   RADIOLOGY CT of abdomen/pelvis reviewed and interpreted by me with no inflammatory changes, focal fluid collections, or dilated bowel loops.  PROCEDURES:  Critical Care performed: No  Procedures   MEDICATIONS ORDERED IN ED: Medications  morphine (  PF) 4 MG/ML injection 4 mg (4 mg Intravenous Given 05/15/22 1154)  lactated ringers bolus 1,000 mL (0 mLs Intravenous Stopped 05/15/22 1432)  iohexol (OMNIPAQUE) 300 MG/ML solution 100 mL (100 mLs Intravenous Contrast Given 05/15/22 1330)     IMPRESSION / MDM / ASSESSMENT AND PLAN / ED COURSE  I reviewed the triage vital signs and the nursing notes.                              46 y.o. female with past medical history of hyperlipidemia, GERD, and anxiety who presents to the ED complaining of pain that started in the left lower quadrant of her abdomen and radiating up towards her left flank, now most tender in the right lower quadrant of her abdomen.  Differential diagnosis includes, but is not limited  to, appendicitis, diverticulitis, kidney stone, UTI, gastroenteritis.  Patient well-appearing and in no acute distress, vital signs are unremarkable.  She does have tenderness to palpation in the bilateral lower quadrants of her abdomen, currently greatest in the right lower quadrant.  Urinalysis does not show any signs of infection, pregnancy testing is pending.  Initial labs are reassuring with CBC showing no anemia or leukocytosis, BMP without electrolyte abnormality or AKI, LFTs and lipase within normal limits.  Given her tenderness, we will further assess with CT scan for appendicitis versus diverticulitis.  Plan to treat symptomatically with IV morphine and hydrate with IV fluids, patient declines antiemetic.  CT scan is unremarkable with no obvious explanation for patient's symptoms.  She does report ongoing pain, although improved following IV morphine.  We will further assess with ultrasound to rule out ovarian torsion or other pelvic pathology for her symptoms.  If this is unremarkable, patient does report a history of endometriosis which could be contributing to her symptoms.  Patient turned over to oncoming provider pending ultrasound results, plan for discharge home with OB/GYN follow-up if ultrasound unremarkable.      FINAL CLINICAL IMPRESSION(S) / ED DIAGNOSES   Final diagnoses:  Lower abdominal pain  Endometriosis     Rx / DC Orders   ED Discharge Orders     None        Note:  This document was prepared using Dragon voice recognition software and may include unintentional dictation errors.   Chesley Noon, MD 05/15/22 1459

## 2022-05-15 NOTE — ED Triage Notes (Signed)
Pt here with sharp left sided flank pain. Pt states pain does not radiate. Pt denies N/V/D.

## 2022-05-15 NOTE — ED Provider Notes (Signed)
Emergency department handoff note  Care of this patient was signed out to me at the end of the previous provider shift.  All pertinent patient information was conveyed and all questions were answered.  Patient pending results of pelvic ultrasound rule out ovarian torsion which did not show any evidence of ovarian torsion or other intrapelvic pathology.  The patient has been reexamined and is ready to be discharged.  All diagnostic results have been reviewed and discussed with the patient/family.  Care plan has been outlined and the patient/family understands all current diagnoses, results, and treatment plans.  There are no new complaints, changes, or physical findings at this time.  All questions have been addressed and answered.  Patient was instructed to, and agrees to follow-up with their primary care physician as well as return to the emergency department if any new or worsening symptoms develop.   Merwyn Katos, MD 05/15/22 [id number]

## 2022-05-15 NOTE — ED Notes (Signed)
Patient states she drove here, but can call someone to pick her up.

## 2022-08-28 ENCOUNTER — Other Ambulatory Visit: Payer: Self-pay | Admitting: Internal Medicine

## 2022-08-29 NOTE — Telephone Encounter (Signed)
Pt needs appt. Left VM to call back to schedule ?

## 2022-08-29 NOTE — Telephone Encounter (Signed)
Requested Prescriptions  Pending Prescriptions Disp Refills  . atorvastatin (LIPITOR) 20 MG tablet [Pharmacy Med Name: ATORVASTATIN 20 MG TABLET] 90 tablet 0    Sig: TAKE 1 TABLET BY MOUTH EVERY DAY     Cardiovascular:  Antilipid - Statins Failed - 08/28/2022  2:17 AM      Failed - Valid encounter within last 12 months    Recent Outpatient Visits          1 year ago Encounter for general adult medical examination with abnormal findings   [facility] [place], Salvadore Oxford, NP   4 years ago Lower respiratory infection   Primary Care at [facility], [place], [place]      Future Appointments            In 2 months Sampson Si, Salvadore Oxford, NP [facility]           Failed - Lipid Panel in normal range within the last 12 months    Cholesterol  Date Value Ref Range Status  08/24/2021 165 <200 mg/dL Final   LDL Cholesterol (Calc)  Date Value Ref Range Status  08/24/2021 59 mg/dL (calc) Final    Comment:    Reference range: <100 . Desirable range <100 mg/dL for primary prevention;   <70 mg/dL for patients with CHD or diabetic patients  with > or = 2 CHD risk factors. Marland Kitchen LDL-C is now calculated using the Martin-Hopkins  calculation, which is a validated novel method providing  better accuracy than the Friedewald equation in the  estimation of LDL-C.  Horald Pollen et al. Lenox Ahr. 8756;433(29): 2061-2068  (http://education.[facility].com/faq/FAQ164)    HDL  Date Value Ref Range Status  08/24/2021 83 > OR = 50 mg/dL Final   Triglycerides  Date Value Ref Range Status  08/24/2021 144 <150 mg/dL Final         Passed - Patient is not pregnant

## 2022-09-06 ENCOUNTER — Other Ambulatory Visit: Payer: Self-pay | Admitting: Internal Medicine

## 2022-09-07 NOTE — Telephone Encounter (Signed)
Requested medication (s) are due for refill today: yes  Requested medication (s) are on the active medication list: yes  Last refill:  02/07/22 #30 with 2 RF  Future visit scheduled: 10/30/22, last seen 08/24/2021  Notes to clinic:  already given a curtesy refill bu does have appt scheduled 10/30/22, please assess.      Requested Prescriptions  Pending Prescriptions Disp Refills   valACYclovir (VALTREX) 500 MG tablet [Pharmacy Med Name: VALACYCLOVIR HCL 500 MG TABLET] 30 tablet 2    Sig: TAKE 1 TABLET BY MOUTH TWICE A DAY FOR 3 DAYS (LAST REFILL)     Antimicrobials:  Antiviral Agents - Anti-Herpetic Failed - 09/06/2022  2:32 PM      Failed - Valid encounter within last 12 months    Recent Outpatient Visits           1 year ago Encounter for general adult medical examination with abnormal findings   [facility] [place], Salvadore Oxford, NP   4 years ago Lower respiratory infection   Primary Care at [facility], [place], [place]       Future Appointments             In 1 month Baity, Salvadore Oxford, NP [facility], [facility]

## 2022-09-11 IMAGING — CT CT ABD-PELV W/ CM
2 of 5 series · 16 of 46 positions shown, 18 images · IV contrast (APPLIED)
Comparison: 09/26/2012

CLINICAL DATA: Right lower quadrant and left flank pain

EXAM:
CT ABDOMEN AND PELVIS WITH CONTRAST
TECHNIQUE: Multidetector CT imaging of the abdomen and pelvis was performed
using the standard protocol following bolus administration of
intravenous contrast.

[Series 2: routine abd/pel with · axial · 0.83mm/px · z∈[-1139,-699]mm · 13 of 100 slices shown, 15 images]
[im 6/100  soft-tissue]
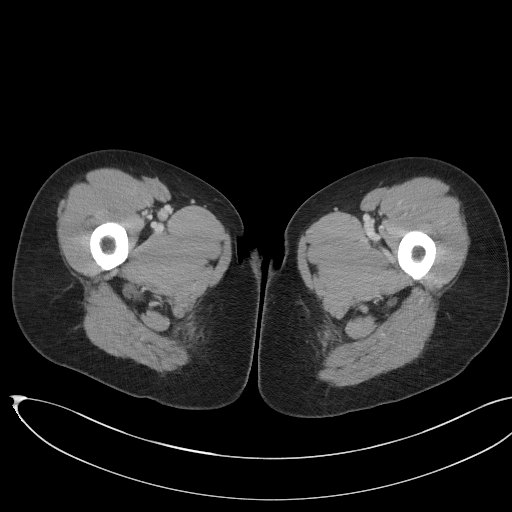
[im 6/100  bone]
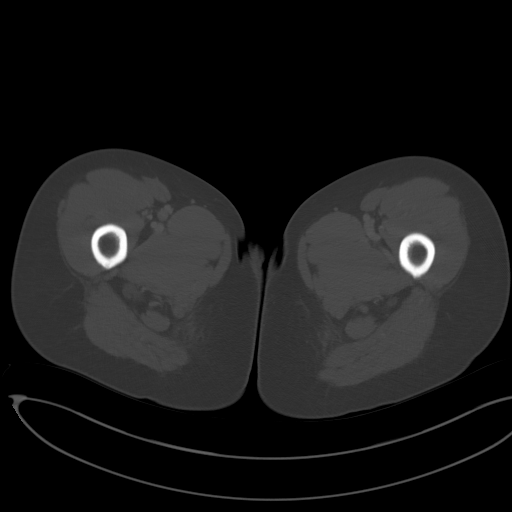
[im 12/100  soft-tissue]
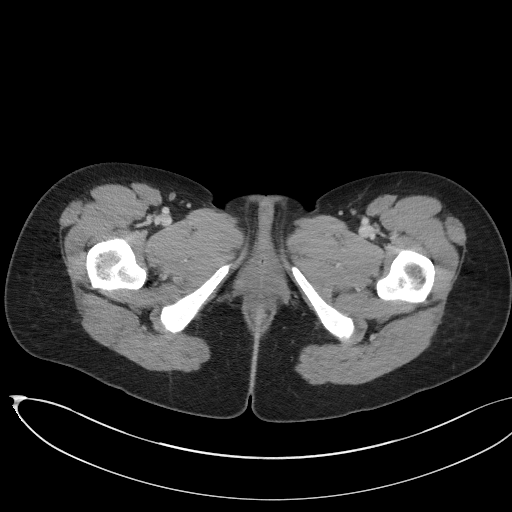
[im 23/100  soft-tissue]
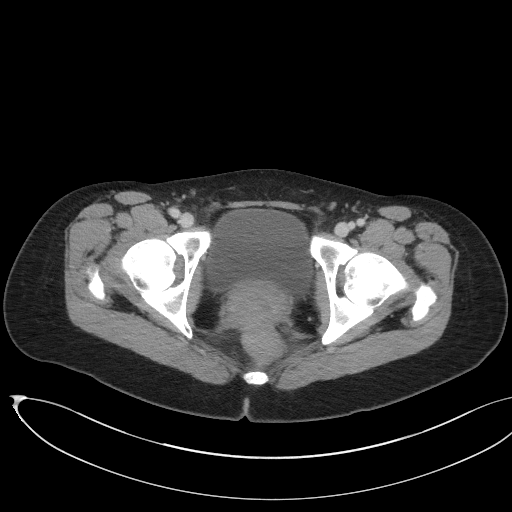
[im 28/100  soft-tissue]
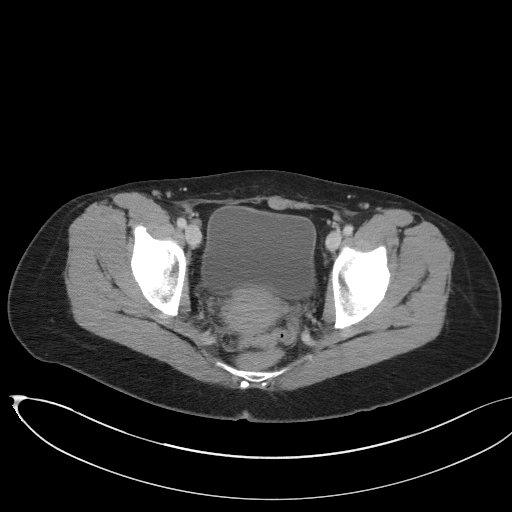
[im 34/100  soft-tissue]
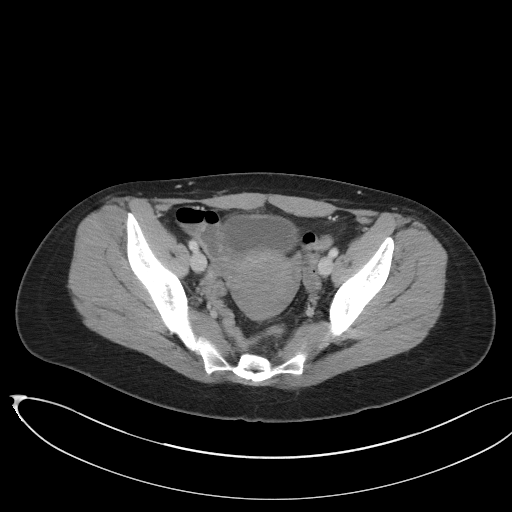
[im 45/100  soft-tissue]
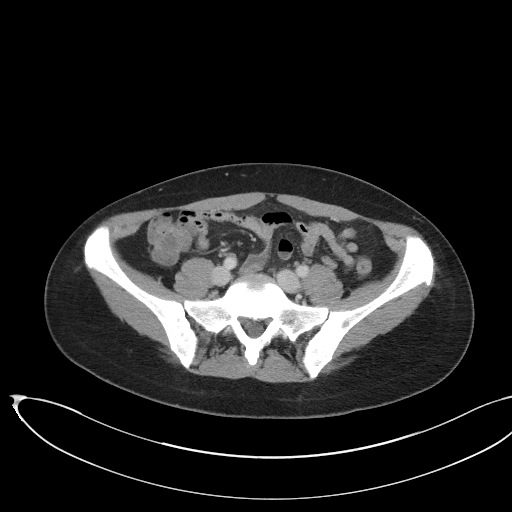
[im 50/100  soft-tissue]
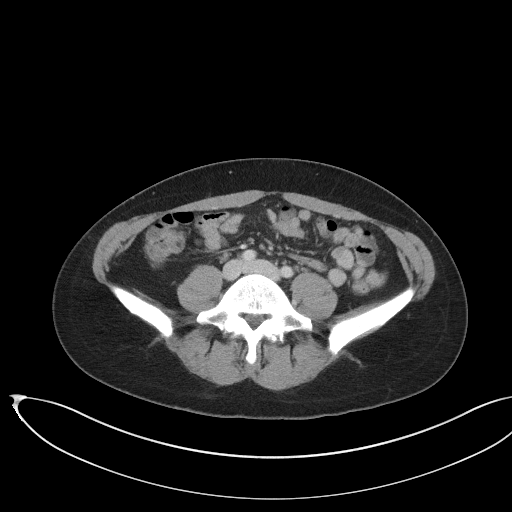
[im 56/100  soft-tissue]
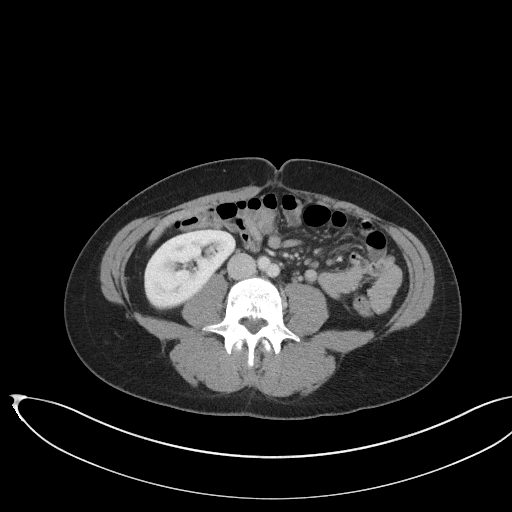
[im 67/100  soft-tissue]
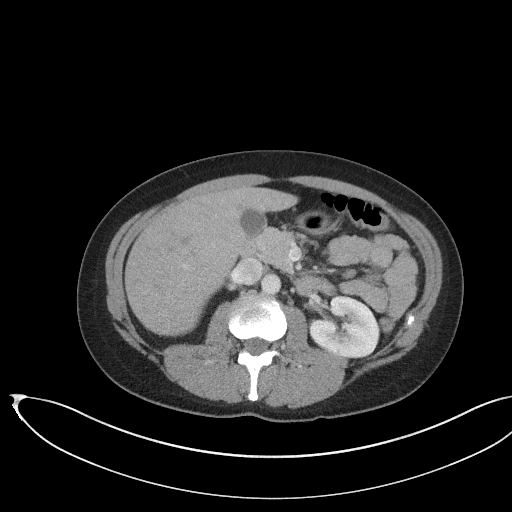
[im 67/100  bone]
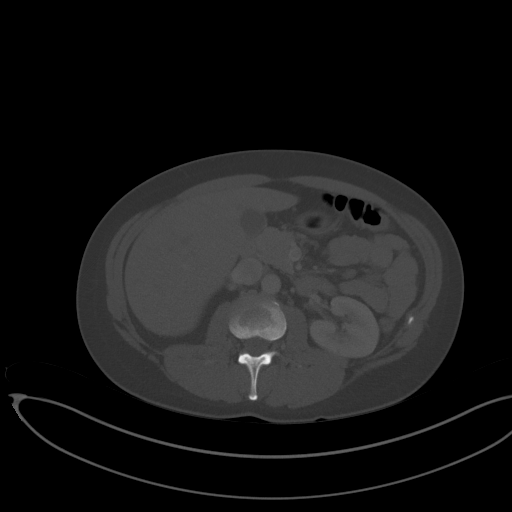
[im 72/100  soft-tissue]
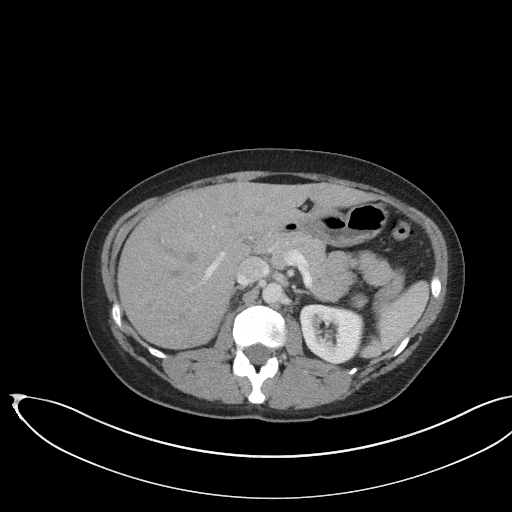
[im 78/100  soft-tissue]
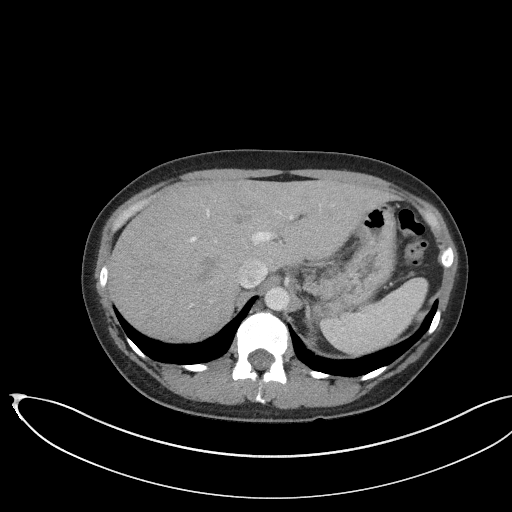
[im 89/100  soft-tissue]
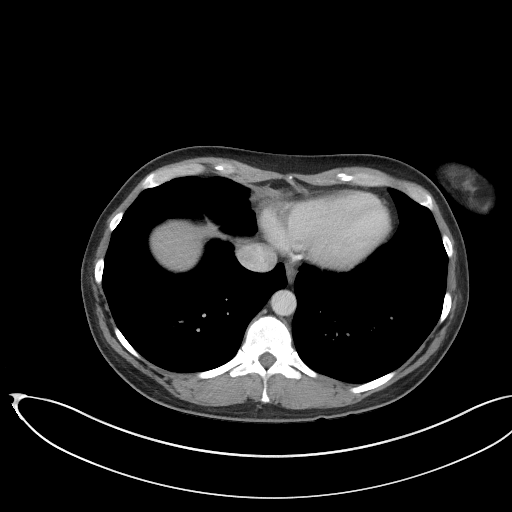
[im 94/100  soft-tissue]
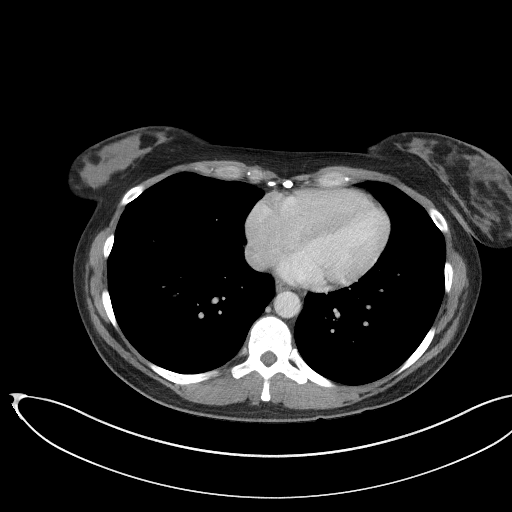

[Series 5: coronal st · coronal · 0.87mm/px · 3 of 86 slices shown]
[im 29/86  soft-tissue]
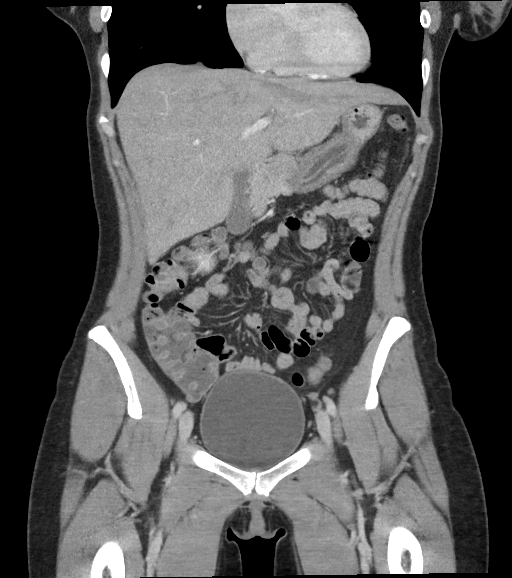
[im 38/86  soft-tissue]
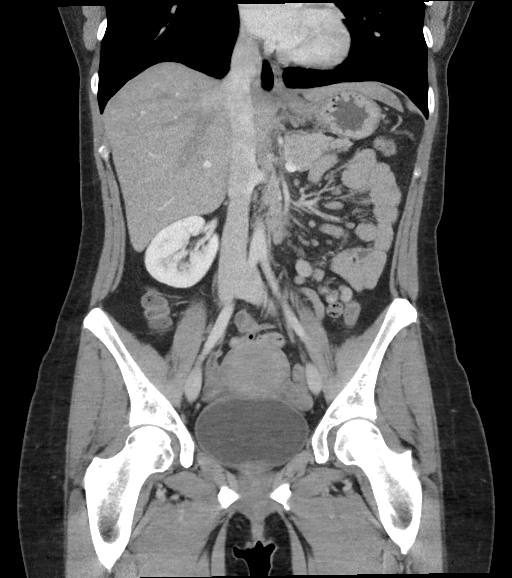
[im 48/86  soft-tissue]
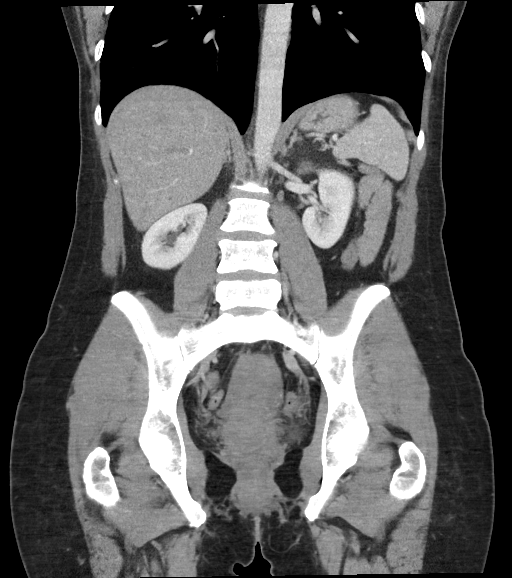

[16 of 46 positions shown; findings below may reference images not displayed]

RADIATION DOSE REDUCTION: This exam was performed according to the
departmental dose-optimization program which includes automated
exposure control, adjustment of the mA and/or kV according to
patient size and/or use of iterative reconstruction technique.

CONTRAST:  100mL OMNIPAQUE IOHEXOL 300 MG/ML  SOLN
FINDINGS: Lower chest: No acute abnormality.

Hepatobiliary: No solid liver abnormality is seen. No gallstones,
gallbladder wall thickening, or biliary dilatation.

Pancreas: Unremarkable. No pancreatic ductal dilatation or
surrounding inflammatory changes.

Spleen: Normal in size without significant abnormality.

Adrenals/Urinary Tract: Adrenal glands are unremarkable. Kidneys are
normal, without renal calculi, solid lesion, or hydronephrosis.
Bladder is unremarkable.

Stomach/Bowel: Stomach is within normal limits. Appendix appears
normal. No evidence of bowel wall thickening, distention, or
inflammatory changes.

Vascular/Lymphatic: No significant vascular findings are present. No
enlarged abdominal or pelvic lymph nodes.

Reproductive: No mass or other significant abnormality.

Other: No abdominal wall hernia or abnormality. Trace nonspecific
fluid in the low pelvis (series 2, image 75).

Musculoskeletal: No acute or significant osseous findings.
IMPRESSION: 1. No acute CT findings of the abdomen or pelvis to explain right
lower quadrant or left flank pain. Specifically, normal appendix. No
evidence of urinary tract calculi or hydronephrosis.
2. Trace nonspecific fluid in the low pelvis, most likely functional
in the reproductive age setting.

## 2022-09-18 ENCOUNTER — Other Ambulatory Visit: Payer: Self-pay | Admitting: Internal Medicine

## 2022-09-18 NOTE — Telephone Encounter (Signed)
Refilled 09/07/2022 #30 2 rf. Requested Prescriptions  Pending Prescriptions Disp Refills  . valACYclovir (VALTREX) 500 MG tablet [Pharmacy Med Name: VALACYCLOVIR HCL 500 MG TABLET] 30 tablet 2    Sig: TAKE 1 TABLET BY MOUTH TWICE A DAY FOR 3 DAYS (LAST REFILL)     Antimicrobials:  Antiviral Agents - Anti-Herpetic Failed - 09/18/2022  3:58 PM      Failed - Valid encounter within last 12 months    Recent Outpatient Visits          1 year ago Encounter for general adult medical examination with abnormal findings   [facility] [place], Coralie Keens, NP   4 years ago Lower respiratory infection   Primary Care at [place], [place]      Future Appointments            In 1 month [place], Coralie Keens, NP [facility], [facility]

## 2022-10-30 ENCOUNTER — Ambulatory Visit: Payer: BC Managed Care – PPO | Admitting: Internal Medicine

## 2022-10-30 ENCOUNTER — Encounter: Payer: Self-pay | Admitting: Internal Medicine

## 2022-10-30 VITALS — BP 122/68 | HR 64 | Temp 96.8°F | Wt 170.0 lb

## 2022-10-30 DIAGNOSIS — Z23 Encounter for immunization: Secondary | ICD-10-CM

## 2022-10-30 DIAGNOSIS — F419 Anxiety disorder, unspecified: Secondary | ICD-10-CM

## 2022-10-30 DIAGNOSIS — E782 Mixed hyperlipidemia: Secondary | ICD-10-CM

## 2022-10-30 DIAGNOSIS — Z6827 Body mass index (BMI) 27.0-27.9, adult: Secondary | ICD-10-CM | POA: Insufficient documentation

## 2022-10-30 DIAGNOSIS — Z6825 Body mass index (BMI) 25.0-25.9, adult: Secondary | ICD-10-CM | POA: Diagnosis not present

## 2022-10-30 DIAGNOSIS — Z0001 Encounter for general adult medical examination with abnormal findings: Secondary | ICD-10-CM

## 2022-10-30 DIAGNOSIS — K5909 Other constipation: Secondary | ICD-10-CM

## 2022-10-30 DIAGNOSIS — G43109 Migraine with aura, not intractable, without status migrainosus: Secondary | ICD-10-CM

## 2022-10-30 DIAGNOSIS — E663 Overweight: Secondary | ICD-10-CM

## 2022-10-30 DIAGNOSIS — N390 Urinary tract infection, site not specified: Secondary | ICD-10-CM | POA: Diagnosis not present

## 2022-10-30 LAB — CBC
HCT: 37.3 % (ref 35.0–45.0)
Hemoglobin: 12.6 g/dL (ref 11.7–15.5)
MCH: 31.5 pg (ref 27.0–33.0)
MCHC: 33.8 g/dL (ref 32.0–36.0)
MCV: 93.3 fL (ref 80.0–100.0)
MPV: 10.2 fL (ref 7.5–12.5)
Platelets: 244 10*3/uL (ref 140–400)
RBC: 4 10*6/uL (ref 3.80–5.10)
RDW: 11.4 % (ref 11.0–15.0)
WBC: 5.7 10*3/uL (ref 3.8–10.8)

## 2022-10-30 LAB — COMPLETE METABOLIC PANEL WITH GFR
AG Ratio: 1.6 (calc) (ref 1.0–2.5)
ALT: 13 U/L (ref 6–29)
AST: 16 U/L (ref 10–30)
Albumin: 4.1 g/dL (ref 3.6–5.1)
Alkaline phosphatase (APISO): 29 U/L — ABNORMAL LOW (ref 31–125)
BUN: 10 mg/dL (ref 7–25)
CO2: 24 mmol/L (ref 20–32)
Calcium: 9.1 mg/dL (ref 8.6–10.2)
Chloride: 105 mmol/L (ref 98–110)
Creat: 0.79 mg/dL (ref 0.50–0.99)
Globulin: 2.5 g/dL (calc) (ref 1.9–3.7)
Glucose, Bld: 87 mg/dL (ref 65–99)
Potassium: 4.2 mmol/L (ref 3.5–5.3)
Sodium: 138 mmol/L (ref 135–146)
Total Bilirubin: 0.6 mg/dL (ref 0.2–1.2)
Total Protein: 6.6 g/dL (ref 6.1–8.1)
eGFR: 95 mL/min/{1.73_m2} (ref >=60)

## 2022-10-30 LAB — LIPID PANEL
Cholesterol: 144 mg/dL (ref <200)
HDL: 76 mg/dL (ref >=50)
LDL Cholesterol (Calc): 45 mg/dL (calc)
Non-HDL Cholesterol (Calc): 68 mg/dL (calc) (ref <130)
Total CHOL/HDL Ratio: 1.9 (calc) (ref <5.0)
Triglycerides: 143 mg/dL (ref <150)

## 2022-10-30 NOTE — Assessment & Plan Note (Signed)
Encourage diet and exercise for weight loss 

## 2022-10-30 NOTE — Assessment & Plan Note (Signed)
Continue MiraLAX daily Encourage high-fiber diet and adequate water intake

## 2022-10-30 NOTE — Assessment & Plan Note (Signed)
Continue Macrobid as needed 

## 2022-10-30 NOTE — Patient Instructions (Signed)

## 2022-10-30 NOTE — Assessment & Plan Note (Signed)
Stable off meds Support offered 

## 2022-10-30 NOTE — Assessment & Plan Note (Signed)
Continue Imitrex as needed 

## 2022-10-30 NOTE — Progress Notes (Signed)
Subjective:    Patient ID: Lindsay Sharp, female    DOB: November 26, 1976, 46 y.o.   MRN: [id number]  HPI  Patient presents the clinic today for her annual exam.  She is also due to follow-up chronic conditions.  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  She takes Imitrex as needed with good relief of symptoms.  She does not follow with neurology.  Anxiety: Intermittent, but she in no longer taking Xanax.  She is not currently seeing a therapist.  She denies depression, SI/HI.  HLD: Her last LDL was 59, triglycerides 144, 08/2021.  She denies myalgias on Atorvastatin and Fish Oil.  She tries to consume a low-fat diet.  Postcoital UTI: Managed with Macrobid after intercourse.  She follows with GYN for this.  Chronic Constipation: Managed with MiraLAX daily.  There is no colonoscopy on file.  Flu: 08/2019 Tetanus: 05/2016 COVID: [place] x2 Pap smear: 02/2021 Mammogram: 02/2021 Vision screening: as needed Dentist: biannually  Diet: She does eat lean meat. She consumes fruits and veggies. She tries to avoid fried foods. She drinks mostly water. Exercise: running, body weight exercises  Review of Systems     Past Medical History:  Diagnosis Date   Chest pain    Chicken pox    Endometriosis    Family history of breast cancer    Family history of ovarian cancer    Hyperlipidemia     Current Outpatient Medications  Medication Sig Dispense Refill   ALPRAZolam (XANAX) 0.25 MG tablet Take 1 tablet (0.25 mg total) by mouth 2 (two) times daily as needed for anxiety. (Patient not taking: Reported on 08/24/2021) 30 tablet 0   atorvastatin (LIPITOR) 20 MG tablet TAKE 1 TABLET BY MOUTH EVERY DAY 90 tablet 0   Coenzyme Q10 (CO Q 10) 100 MG CAPS Take 1 capsule by mouth daily.     NIKKI 3-0.02 MG tablet Take 1 tablet by mouth daily.     Omega-3 Fatty Acids (FISH OIL) 1000 MG CAPS Take 2,000 mg by mouth daily.     polyethylene glycol (MIRALAX / GLYCOLAX) packet Take 17 g by mouth daily.      valACYclovir (VALTREX) 500 MG tablet TAKE 1 TABLET BY MOUTH TWICE A DAY FOR 3 DAYS (LAST REFILL) 30 tablet 2   No current facility-administered medications for this visit.    Allergies  Allergen Reactions   Erythromycin Nausea And Vomiting   Penicillins Hives    Family History  Problem Relation Age of Onset   Hyperlipidemia Father    Melanoma Father        dx in his 17s   Hyperlipidemia Paternal Grandfather    Breast cancer Paternal Aunt        dbl mastectomy; dx in her 39s   Brain cancer Maternal Grandmother        dx in her 56s   Prostate cancer Maternal Grandfather 76   Ovarian cancer Paternal Grandmother        dx in her 56s    Social History   Socioeconomic History   Marital status: Widowed    Spouse name: Carloyn Manner   Number of children: 2   Years of education: Not on file   Highest education level: Not on file  Occupational History   Not on file  Tobacco Use   Smoking status: Former    Packs/day: 1.00    Years: 10.00    Total pack years: 10.00    Types: Cigarettes  Quit date: 08/27/2004    Years since quitting: 18.1   Smokeless tobacco: Never  Vaping Use   Vaping Use: Never used  Substance and Sexual Activity   Alcohol use: Yes    Alcohol/week: 0.0 standard drinks of alcohol    Comment: 1-2 glasses/week   Drug use: No   Sexual activity: Yes    Birth control/protection: Pill  Other Topics Concern   Not on file  Social History Narrative   Not on file   Social Determinants of Health   Financial Resource Strain: Not on file  Food Insecurity: Not on file  Transportation Needs: Not on file  Physical Activity: Not on file  Stress: Not on file  Social Connections: Not on file  Intimate Partner Violence: Not on file     Constitutional: Patient reports intermittent headaches.  Denies fever, malaise, fatigue, or abrupt weight changes.  HEENT: Denies eye pain, eye redness, ear pain, ringing in the ears, wax buildup, runny nose, nasal congestion, bloody  nose, or sore throat. Respiratory: Denies difficulty breathing, shortness of breath, cough or sputum production.   Cardiovascular: Denies chest pain, chest tightness, palpitations or swelling in the hands or feet.  Gastrointestinal: Patient has a history of constipation.  Denies abdominal pain, bloating, diarrhea or blood in the stool.  GU: Denies urgency, frequency, pain with urination, burning sensation, blood in urine, odor or discharge. Musculoskeletal: Denies decrease in range of motion, difficulty with gait, muscle pain or joint pain and swelling.  Skin: Denies redness, rashes, lesions or ulcercations.  Neurological: Denies dizziness, difficulty with memory, difficulty with speech or problems with balance and coordination.  Psych: Patient has a history of anxiety.  Denies depression, SI/HI.  No other specific complaints in a complete review of systems (except as listed in HPI above).  Objective:   Physical Exam  BP 122/68 (BP Location: Right Arm, Patient Position: Sitting, Cuff Size: Normal)   Pulse 64   Temp (!) 96.8 F (36 C) (Temporal)   Wt 170 lb (77.1 kg)   SpO2 99%   BMI 25.85 kg/m   Wt Readings from Last 3 Encounters:  05/15/22 165 lb (74.8 kg)  08/24/21 156 lb 3.2 oz (70.9 kg)  12/02/19 159 lb 4 oz (72.2 kg)    General: Appears her stated age, overweight, in NAD. Skin: Warm, dry and intact.  HEENT: Head: normal shape and size; Eyes: sclera white, no icterus, conjunctiva pink, PERRLA and EOMs intact;  Neck:  Neck supple, trachea midline. No masses, lumps or thyromegaly present.  Cardiovascular: Normal rate and rhythm. S1,S2 noted.  No murmur, rubs or gallops noted. No JVD or BLE edema.  Pulmonary/Chest: Normal effort and positive vesicular breath sounds. No respiratory distress. No wheezes, rales or ronchi noted.  Abdomen:  Normal bowel sounds. Musculoskeletal: Strength 5/5 BUE/BLE.  No difficulty with gait.  Neurological: Alert and oriented. Cranial nerves II-XII  grossly intact. Coordination normal.  Psychiatric: Mood and affect normal. Behavior is normal. Judgment and thought content normal.     BMET    Component Value Date/Time   NA 138 05/15/2022 1018   K 4.3 05/15/2022 1018   CL 106 05/15/2022 1018   CO2 23 05/15/2022 1018   GLUCOSE 96 05/15/2022 1018   BUN 11 05/15/2022 1018   CREATININE 0.79 05/15/2022 1018   CREATININE 0.88 08/24/2021 1530   CALCIUM 9.5 05/15/2022 1018   GFRNONAA >60 05/15/2022 1018    Lipid Panel     Component Value Date/Time   CHOL 165  08/24/2021 1530   TRIG 144 08/24/2021 1530   HDL 83 08/24/2021 1530   CHOLHDL 2.0 08/24/2021 1530   VLDL 21.4 09/10/2019 0838   LDLCALC 59 08/24/2021 1530    CBC    Component Value Date/Time   WBC 7.8 05/15/2022 1018   RBC 4.47 05/15/2022 1018   HGB 13.8 05/15/2022 1018   HCT 41.3 05/15/2022 1018   PLT 269 05/15/2022 1018   MCV 92.4 05/15/2022 1018   MCH 30.9 05/15/2022 1018   MCHC 33.4 05/15/2022 1018   RDW 11.8 05/15/2022 1018   LYMPHSABS 2.7 12/02/2019 0859   MONOABS 0.4 12/02/2019 0859   EOSABS 0.0 12/02/2019 0859   BASOSABS 0.0 12/02/2019 0859    Hgb A1C Lab Results  Component Value Date   HGBA1C 4.7 08/24/2021            Assessment & Plan:   Preventative Health Maintenance:  Flu shot today Tetanus UTD Encouraged her to get her COVID booster Pap smear UTD Mammogram UTD, will request copy Encouraged her to consume a balanced diet and exercise regimen Advised her to see an eye doctor and dentist annually We will check CBC, c-Met and lipid profile today  RTC in 1 year, sooner if needed Webb Silversmith, NP

## 2022-10-30 NOTE — Assessment & Plan Note (Signed)
C-Met and lipid profile today Encouraged her to consume a low-fat diet Continue atorvastatin 

## 2022-11-24 ENCOUNTER — Other Ambulatory Visit: Payer: Self-pay | Admitting: Internal Medicine

## 2022-11-26 NOTE — Telephone Encounter (Signed)
Requested Prescriptions  Pending Prescriptions Disp Refills   atorvastatin (LIPITOR) 20 MG tablet [Pharmacy Med Name: ATORVASTATIN 20 MG TABLET] 90 tablet 3    Sig: TAKE 1 TABLET BY MOUTH EVERY DAY     Cardiovascular:  Antilipid - Statins Failed - 11/24/2022  9:42 AM      Failed - Lipid Panel in normal range within the last 12 months    Cholesterol  Date Value Ref Range Status  10/30/2022 144 <200 mg/dL Final   LDL Cholesterol (Calc)  Date Value Ref Range Status  10/30/2022 45 mg/dL (calc) Final    Comment:    Reference range: <100 . Desirable range <100 mg/dL for primary prevention;   <70 mg/dL for patients with CHD or diabetic patients  with > or = 2 CHD risk factors. Marland Kitchen LDL-C is now calculated using the Martin-Hopkins  calculation, which is a validated novel method providing  better accuracy than the Friedewald equation in the  estimation of LDL-C.  Horald Pollen et al. Lenox Ahr. 7614;709(29): 2061-2068  (http://education.[facility].com/faq/FAQ164)    HDL  Date Value Ref Range Status  10/30/2022 76 > OR = 50 mg/dL Final   Triglycerides  Date Value Ref Range Status  10/30/2022 143 <150 mg/dL Final         Passed - Patient is not pregnant      Passed - Valid encounter within last 12 months    Recent Outpatient Visits           3 weeks ago Encounter for general adult medical examination with abnormal findings   [facility] [place], Salvadore Oxford, NP   1 year ago Encounter for general adult medical examination with abnormal findings   [facility] [place], Salvadore Oxford, NP   4 years ago Lower respiratory infection   [facility], [place], [place]

## 2023-01-15 ENCOUNTER — Ambulatory Visit
Admission: RE | Admit: 2023-01-15 | Discharge: 2023-01-15 | Disposition: A | Discharge status: Home / Self Care | Payer: BC Managed Care – PPO | Source: Ambulatory Visit | Attending: Urgent Care | Admitting: Urgent Care

## 2023-01-15 VITALS — BP 113/79 | HR 87 | Temp 101.4°F | Resp 14 | Wt 160.0 lb

## 2023-01-15 DIAGNOSIS — B349 Viral infection, unspecified: Secondary | ICD-10-CM | POA: Diagnosis not present

## 2023-01-15 DIAGNOSIS — R590 Localized enlarged lymph nodes: Secondary | ICD-10-CM | POA: Diagnosis not present

## 2023-01-15 LAB — POCT INFLUENZA A/B
Influenza A, POC: NEGATIVE
Influenza B, POC: NEGATIVE

## 2023-01-15 MED ORDER — IBUPROFEN 600 MG PO TABS
600.0000 mg | ORAL_TABLET | Freq: Once | ORAL | Status: AC
  Administered 2023-01-15: 600 mg via ORAL

## 2023-01-15 NOTE — ED Provider Notes (Signed)
[facility]    CSN: [id number] Arrival date & time: 01/15/23  8242      History   Chief Complaint Chief Complaint  Patient presents with   Nasal Congestion    HPI Lindsay Sharp is a 47 y.o. female.   Pleasant 47 year old female presents today due to concerns of headache, body aches, fatigue, and fever starting abruptly yesterday around mid afternoon.  She has been taking Tylenol which helps for roughly 3 to 4 hours.  Her last dose of Tylenol was at 5 AM this morning.  She has been vaccinated against influenza.  Patient took 2 home COVID tests both of which were negative.  She reports the headache is primarily in the frontal region and behind her eyes.  She denies reproducible sinus pain or nasal congestion.  She does have a dry tickly cough, more like a throat clearing.  She denies any shortness of breath or chest pain.  She reports some pain in her neck around the area of her swollen lymph nodes, but otherwise denies nuchal rigidity. Denies N/V/D or any abdominal symptoms.     Past Medical History:  Diagnosis Date   Chest pain    Chicken pox    Endometriosis    Family history of breast cancer    Family history of ovarian cancer    Hyperlipidemia     Patient Active Problem List   Diagnosis Date Noted   Postcoital UTI 10/30/2022   Chronic constipation 10/30/2022   Overweight with body mass index (BMI) of 25 to 25.9 in adult 10/30/2022   Anxiety 09/10/2019   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   Hyperlipidemia 08/27/2014    Past Surgical History:  Procedure Laterality Date   CESAREAN SECTION  2006 and 2009    OB History   No obstetric history on file.      Home Medications    Prior to Admission medications   Medication Sig Start Date End Date Taking? Authorizing Provider  atorvastatin (LIPITOR) 20 MG tablet TAKE 1 TABLET BY MOUTH EVERY DAY 11/26/22   Jearld Fenton, NP  Coenzyme Q10 (CO Q 10) 100 MG CAPS Take 1 capsule by mouth daily.    [provider]  NIKKI 3-0.02 MG tablet Take 1 tablet by mouth daily. 11/04/19   [provider]  nitrofurantoin, macrocrystal-monohydrate, (MACROBID) 100 MG capsule Take 100 mg by mouth daily as needed.    [provider]  Omega-3 Fatty Acids (FISH OIL) 1000 MG CAPS Take 2,000 mg by mouth daily.    [provider]  polyethylene glycol (MIRALAX / GLYCOLAX) packet Take 17 g by mouth daily.    [provider]  SUMAtriptan (IMITREX) 25 MG tablet Take 25 mg by mouth every 2 (two) hours as needed for migraine. May repeat in 2 hours if headache persists or recurs.    [provider]  valACYclovir (VALTREX) 500 MG tablet TAKE 1 TABLET BY MOUTH TWICE A DAY FOR 3 DAYS (LAST REFILL) 09/07/22   Jearld Fenton, NP    Family History Family History  Problem Relation Age of Onset   Hyperlipidemia Father    Melanoma Father        dx in his 6s   Hyperlipidemia Paternal Grandfather    Breast cancer Paternal Aunt        dbl mastectomy; dx in her 53s   Brain cancer Maternal Grandmother        dx in her 40s   Prostate cancer Maternal Grandfather  76   Ovarian cancer Paternal Grandmother        dx in her 53s    Social History Social History   Tobacco Use   Smoking status: Former    Packs/day: 1.00    Years: 10.00    Total pack years: 10.00    Types: Cigarettes    Quit date: 08/27/2004    Years since quitting: 18.3   Smokeless tobacco: Never  Vaping Use   Vaping Use: Never used  Substance Use Topics   Alcohol use: Yes    Alcohol/week: 0.0 standard drinks of alcohol    Comment: 1-2 glasses/week   Drug use: No     Allergies   Erythromycin and Penicillins   Review of Systems Review of Systems As per HPI  Physical Exam Triage Vital Signs ED Triage Vitals  Enc Vitals Group     BP 01/15/23 0844 113/79     Pulse Rate 01/15/23 0844 87     Resp 01/15/23 0844 14     Temp 01/15/23 0844 (!) 101.4 F (38.6 C)     Temp Source 01/15/23 0844 Oral      SpO2 01/15/23 0844 100 %     Weight 01/15/23 0849 160 lb (72.6 kg)     Height --      Head Circumference --      Peak Flow --      Pain Score 01/15/23 0849 4     Pain Loc --      Pain Edu? --      Excl. in GC? --    No data found.  Updated Vital Signs BP 113/79 (BP Location: Left Arm)   Pulse 87   Temp (!) 101.4 F (38.6 C) (Oral)   Resp 14   Wt 160 lb (72.6 kg)   SpO2 100%   BMI 24.33 kg/m   Visual Acuity Right Eye Distance:   Left Eye Distance:   Bilateral Distance:    Right Eye Near:   Left Eye Near:    Bilateral Near:     Physical Exam Vitals and nursing note reviewed.  Constitutional:      General: She is not in acute distress.    Appearance: Normal appearance. She is well-developed and normal weight. She is ill-appearing. She is not toxic-appearing or diaphoretic.  HENT:     Head: Normocephalic and atraumatic.     Right Ear: Tympanic membrane, ear canal and external ear normal. There is no impacted cerumen.     Left Ear: Tympanic membrane, ear canal and external ear normal.     Nose: Nose normal. No congestion or rhinorrhea.     Mouth/Throat:     Mouth: Mucous membranes are moist.     Pharynx: Oropharynx is clear. No oropharyngeal exudate or posterior oropharyngeal erythema.  Eyes:     General: No scleral icterus.       Right eye: No discharge.        Left eye: No discharge.     Extraocular Movements: Extraocular movements intact.     Conjunctiva/sclera: Conjunctivae normal.     Pupils: Pupils are equal, round, and reactive to light.  Cardiovascular:     Rate and Rhythm: Normal rate and regular rhythm.     Pulses: Normal pulses.     Heart sounds: No murmur heard. Pulmonary:     Effort: Pulmonary effort is normal. No respiratory distress.     Breath sounds: Normal breath sounds. No stridor. No wheezing, rhonchi or rales.  Chest:  Chest wall: No tenderness.  Abdominal:     Palpations: Abdomen is soft.     Tenderness: There is no abdominal  tenderness.  Musculoskeletal:        General: No swelling.     Cervical back: Normal range of motion and neck supple. No rigidity or tenderness.  Lymphadenopathy:     Cervical: Cervical adenopathy (R>L anterior cervical chain) present.  Skin:    General: Skin is warm and dry.     Capillary Refill: Capillary refill takes less than 2 seconds.     Findings: No bruising, erythema or rash.  Neurological:     General: No focal deficit present.     Mental Status: She is alert and oriented to person, place, and time.  Psychiatric:        Mood and Affect: Mood normal.      UC Treatments / Results  Labs (all labs ordered are listed, but only abnormal results are displayed) Labs Reviewed  SARS CORONAVIRUS 2 (TAT 6-24 HRS)  POCT INFLUENZA A/B    EKG   Radiology No results found.  Procedures Procedures (including critical care time)  Medications Ordered in UC Medications  ibuprofen (ADVIL) tablet 600 mg (600 mg Oral Given 01/15/23 0910)    Initial Impression / Assessment and Plan / UC Course  I have reviewed the triage vital signs and the nursing notes.  Pertinent labs & imaging results that were available during my care of the patient were reviewed by me and considered in my medical decision making (see chart for details).     Viral syndrome - negative office flu test. Will sent out PCR covid as pt's home Ag test was neg x 2. Clinically suspect viral illness. Supportive measures discussed. Fever - ibuprofen given in office. Alternate antipyretic therapy. Monitor. Lymphadenopathy - anterior cervical chain bilaterally without photophobia or nuchal rigidity, favors viral process.    Final Clinical Impressions(s) / UC Diagnoses   Final diagnoses:  Acute viral syndrome  Lymphadenopathy, cervical     Discharge Instructions      Your symptoms sound most consistent with a viral illness. Your flu test is negative. We sent out a PCR covid test for confirmation. Supportive  care is the mainstay of treatment - rest, hydration, frequent hand washing, tylenol or ibuprofen as needed for fever or aches.  You can take 1000mg  tylenol every 8 hours alternating with 800mg  ibuprofen every 8 hours. You were given Ibuprofen in our office today at 9:10am; you can take your next dose of Tylenol around 1pm this afternoon. OTC Oscillococcinum is helpful for body aches. A warm shower or Epsom salt bath may also help with your body aches and fatigue.  If you develop photophobia (pain when looking at light) or inability to move your neck, please head to the ER.     ED Prescriptions   None    PDMP not reviewed this encounter.   Chaney Malling, [place] 01/15/23 1019

## 2023-01-15 NOTE — Discharge Instructions (Addendum)
Your symptoms sound most consistent with a viral illness. Your flu test is negative. We sent out a PCR covid test for confirmation. Supportive care is the mainstay of treatment - rest, hydration, frequent hand washing, tylenol or ibuprofen as needed for fever or aches.  You can take 1000mg  tylenol every 8 hours alternating with 800mg  ibuprofen every 8 hours. You were given Ibuprofen in our office today at 9:10am; you can take your next dose of Tylenol around 1pm this afternoon. OTC Oscillococcinum is helpful for body aches. A warm shower or Epsom salt bath may also help with your body aches and fatigue.  If you develop photophobia (pain when looking at light) or inability to move your neck, please head to the ER.

## 2023-01-15 NOTE — ED Triage Notes (Addendum)
Per pt, "Fever, body aches, dizziness" Symptoms started yesterday w/ sinus pressure & fever & chills Temp 101.2 in triage Tylenol  1 gm at 0500 Negative home COVID test x 2 yesterday

## 2023-01-16 LAB — SARS CORONAVIRUS 2 (TAT 6-24 HRS): SARS Coronavirus 2: POSITIVE — AB

## 2023-02-16 ENCOUNTER — Ambulatory Visit
Admission: RE | Admit: 2023-02-16 | Discharge: 2023-02-16 | Disposition: A | Discharge status: Home / Self Care | Payer: BC Managed Care – PPO | Source: Ambulatory Visit | Attending: Family Medicine | Admitting: Family Medicine

## 2023-02-16 VITALS — BP 104/69 | HR 70 | Temp 98.2°F | Resp 16

## 2023-02-16 DIAGNOSIS — S99921A Unspecified injury of right foot, initial encounter: Secondary | ICD-10-CM

## 2023-02-16 DIAGNOSIS — L03115 Cellulitis of right lower limb: Secondary | ICD-10-CM

## 2023-02-16 MED ORDER — DOXYCYCLINE HYCLATE 100 MG PO CAPS: 100.0000 mg | ORAL_CAPSULE | Freq: Two times a day (BID) | ORAL | 0 refills | Status: AC

## 2023-02-16 NOTE — ED Triage Notes (Signed)
Pt reports scraping the side of her left foot when getting out of a hot tub last Sunday. States since then she has noticed the area to be red, swollen and painful. Also noticed some yellow drainage. Denies fever.  Has been using antibiotic ointment and covering the area with a bandaid.

## 2023-02-16 NOTE — ED Provider Notes (Signed)
[facility]    CSN: [id number] Arrival date & time: 02/16/23  [id number]      History   Chief Complaint Chief Complaint  Patient presents with   Foot Injury    Entered by patient    HPI Lindsay Sharp is a 47 y.o. female.   HPI Pleasant 47 year old female presents with right foot injury.  Patient reports frequent area of left foot while getting out of hot tub last Sunday, 02/10/2023.  PMH significant for HLD, ocular migraine, and endometriosis.  Patient is accompanied by her husband this morning.  Past Medical History:  Diagnosis Date   Chest pain    Chicken pox    Endometriosis    Family history of breast cancer    Family history of ovarian cancer    Hyperlipidemia     Patient Active Problem List   Diagnosis Date Noted   Postcoital UTI 10/30/2022   Chronic constipation 10/30/2022   Overweight with body mass index (BMI) of 25 to 25.9 in adult 10/30/2022   Anxiety 09/10/2019   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   Hyperlipidemia 08/27/2014    Past Surgical History:  Procedure Laterality Date   CESAREAN SECTION  2006 and 2009    OB History   No obstetric history on file.      Home Medications    Prior to Admission medications   Medication Sig Start Date End Date Taking? Authorizing Provider  doxycycline (VIBRAMYCIN) 100 MG capsule Take 1 capsule (100 mg total) by mouth 2 (two) times daily for 7 days. 02/16/23 02/23/23 Yes Eliezer Lofts, FNP  atorvastatin (LIPITOR) 20 MG tablet TAKE 1 TABLET BY MOUTH EVERY DAY 11/26/22   Jearld Fenton, NP  Coenzyme Q10 (CO Q 10) 100 MG CAPS Take 1 capsule by mouth daily.    [provider]  NIKKI 3-0.02 MG tablet Take 1 tablet by mouth daily. 11/04/19   [provider]  nitrofurantoin, macrocrystal-monohydrate, (MACROBID) 100 MG capsule Take 100 mg by mouth daily as needed.    [provider]  Omega-3 Fatty Acids (FISH OIL) 1000 MG CAPS Take 2,000 mg by mouth daily.    [provider]   polyethylene glycol (MIRALAX / GLYCOLAX) packet Take 17 g by mouth daily.    [provider]  SUMAtriptan (IMITREX) 25 MG tablet Take 25 mg by mouth every 2 (two) hours as needed for migraine. May repeat in 2 hours if headache persists or recurs.    [provider]  valACYclovir (VALTREX) 500 MG tablet TAKE 1 TABLET BY MOUTH TWICE A DAY FOR 3 DAYS (LAST REFILL) 09/07/22   Jearld Fenton, NP    Family History Family History  Problem Relation Age of Onset   Hyperlipidemia Father    Melanoma Father        dx in his 49s   Hyperlipidemia Paternal Grandfather    Breast cancer Paternal Aunt        dbl mastectomy; dx in her 52s   Brain cancer Maternal Grandmother        dx in her 12s   Prostate cancer Maternal Grandfather 76   Ovarian cancer Paternal Grandmother        dx in her 45s    Social History Social History   Tobacco Use   Smoking status: Former    Packs/day: 1.00    Years: 10.00    Total pack years: 10.00    Types: Cigarettes    Quit date: 08/27/2004    Years  since quitting: 18.4   Smokeless tobacco: Never  Vaping Use   Vaping Use: Never used  Substance Use Topics   Alcohol use: Yes    Alcohol/week: 0.0 standard drinks of alcohol    Comment: 1-2 glasses/week   Drug use: No     Allergies   Erythromycin and Penicillins   Review of Systems Review of Systems  Skin:  Positive for wound.       Right outer foot wound x 6 days  All other systems reviewed and are negative.    Physical Exam Triage Vital Signs ED Triage Vitals  Enc Vitals Group     BP      Pulse      Resp      Temp      Temp src      SpO2      Weight      Height      Head Circumference      Peak Flow      Pain Score      Pain Loc      Pain Edu?      Excl. in [place]?    No data found.  Updated Vital Signs BP 104/69 (BP Location: Right Arm)   Pulse 70   Temp 98.2 F (36.8 C) (Oral)   Resp 16   SpO2 97%       Physical Exam Vitals and nursing note reviewed.   Constitutional:      General: She is not in acute distress.    Appearance: Normal appearance. She is normal weight. She is not ill-appearing.  HENT:     Head: Normocephalic and atraumatic.     Mouth/Throat:     Mouth: Mucous membranes are moist.     Pharynx: Oropharynx is clear.  Eyes:     Extraocular Movements: Extraocular movements intact.     Conjunctiva/sclera: Conjunctivae normal.     Pupils: Pupils are equal, round, and reactive to light.  Cardiovascular:     Rate and Rhythm: Normal rate and regular rhythm.     Pulses: Normal pulses.     Heart sounds: Normal heart sounds. No murmur heard.    No friction rub. No gallop.  Pulmonary:     Effort: Pulmonary effort is normal.     Breath sounds: Normal breath sounds. No wheezing, rhonchi or rales.  Musculoskeletal:        General: Normal range of motion.  Skin:    General: Skin is warm and dry.     Comments: Right foot (superior lateral aspect over fifth metatarsal head): ~1.5 cm x 1.0 cm oval-shaped wound well-healing with 40% eschar formation mildly erythematous/indurated wound borders noted-please see image below  Neurological:     General: No focal deficit present.     Mental Status: She is alert and oriented to person, place, and time. Mental status is at baseline.  Psychiatric:        Mood and Affect: Mood normal.        Behavior: Behavior normal.      UC Treatments / Results  Labs (all labs ordered are listed, but only abnormal results are displayed) Labs Reviewed - No data to display  EKG   Radiology No results found.  Procedures Procedures (including critical care time)  Medications Ordered in UC Medications - No data to display  Initial Impression / Assessment and Plan / UC Course  I have reviewed the triage vital signs and the nursing notes.  Pertinent labs &  imaging results that were available during my care of the patient were reviewed by me and considered in my medical decision making (see chart  for details).     MDM: 1.  Right foot cellulitis-Rx'd Doxycycline; 2. Injury of right foot, initial encounter-small well-healing wound of the right superior lateral area, patient reports injuring initially on Sunday, 02/10/2023 while exiting hot tub. Instructed patient to take medication as directed with food to completion.  Encouraged patient to increase daily water intake while taking this medication to 64 ounces per day.  Encouraged patient to leave the wound area open and applied no topical antibiotics in order to allow healing by secondary intention and deformed scab.  Advised if symptoms worsen and/or unresolved please follow-up with PCP or here for further evaluation.  Patient discharged home, hemodynamically stable. Final Clinical Impressions(s) / UC Diagnoses   Final diagnoses:  Injury of right foot, initial encounter  Cellulitis of right foot     Discharge Instructions      Instructed patient to take medication as directed with food to completion.  Encouraged patient to increase daily water intake while taking this medication to 64 ounces per day.  Encouraged patient to leave the wound area open and applied no topical antibiotics in order to allow healing by secondary intention and deformed scab.  Advised if symptoms worsen and/or unresolved please follow-up with PCP or here for further evaluation.     ED Prescriptions     Medication Sig Dispense Auth. Provider   doxycycline (VIBRAMYCIN) 100 MG capsule Take 1 capsule (100 mg total) by mouth 2 (two) times daily for 7 days. 14 capsule Eliezer Lofts, FNP      PDMP not reviewed this encounter.   Eliezer Lofts, [place] 02/16/23 [phone]

## 2023-02-16 NOTE — Discharge Instructions (Addendum)
Instructed patient to take medication as directed with food to completion.  Encouraged patient to increase daily water intake while taking this medication to 64 ounces per day.  Encouraged patient to leave the wound area open and applied no topical antibiotics in order to allow healing by secondary intention and deformed scab.  Advised if symptoms worsen and/or unresolved please follow-up with PCP or here for further evaluation.

## 2023-05-21 ENCOUNTER — Other Ambulatory Visit: Payer: Self-pay | Admitting: Internal Medicine

## 2023-05-21 NOTE — Telephone Encounter (Signed)
Medication Refill - Medication: valACYclovir (VALTREX) 500 MG tablet   Has the patient contacted their pharmacy? Yes.   No refills  Preferred Pharmacy (with phone number or street name):  [id number] - [place], [place] - [address] Phone: [phone]  Fax: [phone]     Has the patient been seen for an appointment in the last year OR does the patient have an upcoming appointment? No.  Agent: Please be advised that RX refills may take up to 3 business days. We ask that you follow-up with your pharmacy.

## 2023-05-22 NOTE — Telephone Encounter (Signed)
Requested medication (s) are due for refill today: yes  Requested medication (s) are on the active medication list: yes  Last refill:  09/07/22 #30/2  Future visit scheduled: no  Notes to clinic:  per directions rx was for 3 days, Ok to refill?    Requested Prescriptions  Pending Prescriptions Disp Refills   valACYclovir (VALTREX) 500 MG tablet 30 tablet 2    Sig: TAKE 1 TABLET BY MOUTH TWICE A DAY FOR 3 DAYS (LAST REFILL)     Antimicrobials:  Antiviral Agents - Anti-Herpetic Passed - 05/21/2023  1:40 PM      Passed - Valid encounter within last 12 months    Recent Outpatient Visits           6 months ago Encounter for general adult medical examination with abnormal findings   [place] [facility] [place], Salvadore Oxford, NP   1 year ago Encounter for general adult medical examination with abnormal findings   [place] [facility] [place], Salvadore Oxford, NP   5 years ago Lower respiratory infection   Primary Care at [facility], [place], [place]

## 2023-05-23 MED ORDER — VALACYCLOVIR HCL 500 MG PO TABS: ORAL_TABLET | ORAL | 0 refills | Status: DC

## 2023-05-30 ENCOUNTER — Other Ambulatory Visit: Payer: Self-pay | Admitting: Internal Medicine

## 2023-05-31 NOTE — Telephone Encounter (Signed)
Requested medication (s) are due for refill today:   Yes  Requested medication (s) are on the active medication list:   Yes  Future visit scheduled:   No   Last ordered: 05/23/2023 #30, 0 refills  Returned because this rx was for 3 days only.   Regina to review for further refills.   Requested Prescriptions  Pending Prescriptions Disp Refills   valACYclovir (VALTREX) 500 MG tablet [Pharmacy Med Name: VALACYCLOVIR HCL 500 MG TABLET] 180 tablet 1    Sig: TAKE 1 TABLET BY MOUTH TWICE A DAY FOR 3 DAYS (LAST REFILL)     Antimicrobials:  Antiviral Agents - Anti-Herpetic Passed - 05/30/2023  2:31 PM      Passed - Valid encounter within last 12 months    Recent Outpatient Visits           7 months ago Encounter for general adult medical examination with abnormal findings   [place] [facility] [place], Salvadore Oxford, NP   1 year ago Encounter for general adult medical examination with abnormal findings   [place] [facility] [place], Salvadore Oxford, NP   5 years ago Lower respiratory infection   Primary Care at [facility], [place], [place]

## 2023-10-18 LAB — HM PAP SMEAR: HM Pap smear: NORMAL

## 2023-10-18 LAB — HM MAMMOGRAPHY

## 2023-11-24 ENCOUNTER — Other Ambulatory Visit: Payer: Self-pay | Admitting: Internal Medicine

## 2023-11-27 NOTE — Telephone Encounter (Signed)
Attempted to call patient for appointment- no answer- left message to call office. Courtesy refill #6 given.  Requested Prescriptions  Pending Prescriptions Disp Refills   valACYclovir (VALTREX) 500 MG tablet [Pharmacy Med Name: VALACYCLOVIR HCL 500 MG TABLET] 30 tablet 0    Sig: TAKE 1 TABLET BY MOUTH TWICE A DAY FOR 3 DAYS (LAST REFILL)     Antimicrobials:  Antiviral Agents - Anti-Herpetic Failed - 11/24/2023  4:05 PM      Failed - Valid encounter within last 12 months    Recent Outpatient Visits           1 year ago Encounter for general adult medical examination with abnormal findings   [place] [facility] [place], Salvadore Oxford, NP   2 years ago Encounter for general adult medical examination with abnormal findings   [place] [facility] [place], Salvadore Oxford, NP   5 years ago Lower respiratory infection   Primary Care at [facility], [place], [place]

## 2023-11-28 ENCOUNTER — Other Ambulatory Visit: Payer: Self-pay | Admitting: Internal Medicine

## 2023-11-29 NOTE — Telephone Encounter (Signed)
Requested medication (s) are due for refill today: yes  Requested medication (s) are on the active medication list: yes  Last refill:  11/26/22 #90/3  Future visit scheduled: no  Notes to clinic:  rx is expired. Unable to refill per protocol due to failed labs, no updated results.      Requested Prescriptions  Pending Prescriptions Disp Refills   atorvastatin (LIPITOR) 20 MG tablet [Pharmacy Med Name: ATORVASTATIN 20 MG TABLET] 90 tablet 3    Sig: TAKE 1 TABLET BY MOUTH EVERY DAY     Cardiovascular:  Antilipid - Statins Failed - 11/28/2023 12:55 AM      Failed - Valid encounter within last 12 months    Recent Outpatient Visits           1 year ago Encounter for general adult medical examination with abnormal findings   [place] [facility] [place], Salvadore Oxford, NP   2 years ago Encounter for general adult medical examination with abnormal findings   [place] [facility] [place], [place], NP   5 years ago Lower respiratory infection   Primary Care at [facility], [place], [place]              Failed - Lipid Panel in normal range within the last 12 months    Cholesterol  Date Value Ref Range Status  10/30/2022 144 <200 mg/dL Final   LDL Cholesterol (Calc)  Date Value Ref Range Status  10/30/2022 45 mg/dL (calc) Final    Comment:    Reference range: <100 . Desirable range <100 mg/dL for primary prevention;   <70 mg/dL for patients with CHD or diabetic patients  with > or = 2 CHD risk factors. Marland Kitchen LDL-C is now calculated using the Martin-Hopkins  calculation, which is a validated novel method providing  better accuracy than the Friedewald equation in the  estimation of LDL-C.  Horald Pollen et al. Lenox Ahr. 4098;119(14): 2061-2068  (http://education.[facility].com/faq/FAQ164)    HDL  Date Value Ref Range Status  10/30/2022 76 > OR = 50 mg/dL Final   Triglycerides  Date Value Ref Range Status  10/30/2022 143 <150 mg/dL Final          Passed - Patient is not pregnant

## 2023-12-06 ENCOUNTER — Other Ambulatory Visit: Payer: Self-pay | Admitting: Internal Medicine

## 2023-12-06 NOTE — Telephone Encounter (Signed)
Requested medication (s) are due for refill today: yes  Requested medication (s) are on the active medication list: yes  Last refill:  11/26/22 #90/3  Future visit scheduled: yes  Notes to clinic:  Unable to refill per protocol due to failed labs, no updated results.      Requested Prescriptions  Pending Prescriptions Disp Refills   atorvastatin (LIPITOR) 20 MG tablet 90 tablet 3    Sig: Take 1 tablet (20 mg total) by mouth daily.     Cardiovascular:  Antilipid - Statins Failed - 12/06/2023  4:11 PM      Failed - Valid encounter within last 12 months    Recent Outpatient Visits           1 year ago Encounter for general adult medical examination with abnormal findings   [place] [facility] [place], Salvadore Oxford, NP   2 years ago Encounter for general adult medical examination with abnormal findings   [place] [facility] [place], Salvadore Oxford, NP   5 years ago Lower respiratory infection   Primary Care at [facility], [place], [place]       Future Appointments             In 3 weeks [place], Salvadore Oxford, NP [facility] [place] [facility] [place]            Failed - Lipid Panel in normal range within the last 12 months    Cholesterol  Date Value Ref Range Status  10/30/2022 144 <200 mg/dL Final   LDL Cholesterol (Calc)  Date Value Ref Range Status  10/30/2022 45 mg/dL (calc) Final    Comment:    Reference range: <100 . Desirable range <100 mg/dL for primary prevention;   <70 mg/dL for patients with CHD or diabetic patients  with > or = 2 CHD risk factors. Marland Kitchen LDL-C is now calculated using the Martin-Hopkins  calculation, which is a validated novel method providing  better accuracy than the Friedewald equation in the  estimation of LDL-C.  Horald Pollen et al. Lenox Ahr. 3086;578(46): 2061-2068  (http://education.[facility].com/faq/FAQ164)    HDL  Date Value Ref Range Status  10/30/2022 76 > OR = 50 mg/dL Final   Triglycerides  Date  Value Ref Range Status  10/30/2022 143 <150 mg/dL Final         Passed - Patient is not pregnant

## 2023-12-06 NOTE — Telephone Encounter (Signed)
Medication Refill -  Most Recent Primary Care Visit:  Provider: Lorre Munroe  Department: [facility]  Visit Type: OFFICE VISIT  Date: 10/30/2022  Medication:  atorvastatin (LIPITOR) 20 MG tablet  *Patient has scheduled a CPE for next available and needs a refill up until her appt date on 12/30/23   Has the patient contacted their pharmacy? Yes, advised to contact PCP  Is this the correct pharmacy for this prescription? Yes, the one listed below.  This is the patient's preferred pharmacy:  [id number] - [place], [place] - [address] Phone: [phone] Fax: [phone]   Has the prescription been filled recently?  Receipt confirmed by pharmacy (11/26/2022 11:15 AM EST)  Is the patient out of the medication? Yes, completely out and been without it for a few days.  Has the patient been seen for an appointment in the last year OR does the patient have an upcoming appointment? Yes. CPE scheduled for 12/30/2023

## 2023-12-10 MED ORDER — ATORVASTATIN CALCIUM 20 MG PO TABS: 20.0000 mg | ORAL_TABLET | Freq: Every day | ORAL | 0 refills | Status: DC

## 2023-12-26 ENCOUNTER — Encounter: Payer: Self-pay | Admitting: Obstetrics and Gynecology

## 2023-12-30 ENCOUNTER — Encounter: Payer: Self-pay | Admitting: Internal Medicine

## 2023-12-30 ENCOUNTER — Ambulatory Visit (INDEPENDENT_AMBULATORY_CARE_PROVIDER_SITE_OTHER): Payer: 59 | Admitting: Internal Medicine

## 2023-12-30 VITALS — BP 110/68 | Ht 67.5 in | Wt 175.2 lb

## 2023-12-30 DIAGNOSIS — K5909 Other constipation: Secondary | ICD-10-CM | POA: Diagnosis not present

## 2023-12-30 DIAGNOSIS — M1812 Unilateral primary osteoarthritis of first carpometacarpal joint, left hand: Secondary | ICD-10-CM | POA: Insufficient documentation

## 2023-12-30 DIAGNOSIS — G43109 Migraine with aura, not intractable, without status migrainosus: Secondary | ICD-10-CM

## 2023-12-30 DIAGNOSIS — R739 Hyperglycemia, unspecified: Secondary | ICD-10-CM

## 2023-12-30 DIAGNOSIS — E782 Mixed hyperlipidemia: Secondary | ICD-10-CM | POA: Diagnosis not present

## 2023-12-30 DIAGNOSIS — Z0001 Encounter for general adult medical examination with abnormal findings: Secondary | ICD-10-CM

## 2023-12-30 DIAGNOSIS — N390 Urinary tract infection, site not specified: Secondary | ICD-10-CM

## 2023-12-30 DIAGNOSIS — F419 Anxiety disorder, unspecified: Secondary | ICD-10-CM

## 2023-12-30 MED ORDER — VALACYCLOVIR HCL 500 MG PO TABS: ORAL_TABLET | ORAL | 1 refills | Status: DC

## 2023-12-30 NOTE — Assessment & Plan Note (Signed)
Stable off meds Support offered 

## 2023-12-30 NOTE — Assessment & Plan Note (Signed)
Continue Macrobid as needed 

## 2023-12-30 NOTE — Assessment & Plan Note (Signed)
C-Met and lipid profile today Encouraged her to consume a low-fat diet Continue atorvastatin 

## 2023-12-30 NOTE — Progress Notes (Signed)
 Subjective:    Patient ID: Lindsay Sharp, female    DOB: 19-Feb-1976, 48 y.o.   MRN: [id number]  HPI  Patient presents the clinic today for her annual exam.  She is also due to follow up chronic conditions.  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  She rarely takes the imitrex. She does not follow with neurology.   Anxiety: Intermittent, but she in not currently taking any medications for this.  She is not currently seeing a therapist.  She denies depression, SI/HI.   HLD: Her last LDL was 45, triglycerides 856, 10/2022.  She denies myalgias on atorvastatin  and fish oil.  She tries to consume a low-fat diet.   Postcoital UTI: Managed with macrobid after intercourse.  She follows with GYN for this.   Chronic constipation: Managed with miralax daily.  There is no colonoscopy on file.  Flu: 10/2022 Tetanus: 05/2016 COVID: Pfizer x2 Pap smear: 02/2021 Mammogram: 10/2023, [facility]  Colon screening: Never Vision screening: as needed Dentist: biannually  Diet: She does eat lean meat. She consumes fruits and veggies. She tries to avoid fried foods. She drinks mostly water. Exercise: running, body weight exercises  Review of Systems     Past Medical History:  Diagnosis Date   Chest pain    Chicken pox    Endometriosis    Family history of breast cancer    Family history of ovarian cancer    Hyperlipidemia     Current Outpatient Medications  Medication Sig Dispense Refill   atorvastatin  (LIPITOR) 20 MG tablet Take 1 tablet (20 mg total) by mouth daily. 90 tablet 0   Coenzyme Q10 (CO Q 10) 100 MG CAPS Take 1 capsule by mouth daily.     NIKKI 3-0.02 MG tablet Take 1 tablet by mouth daily.     nitrofurantoin, macrocrystal-monohydrate, (MACROBID) 100 MG capsule Take 100 mg by mouth daily as needed.     Omega-3 Fatty Acids (FISH OIL) 1000 MG CAPS Take 2,000 mg by mouth daily.     polyethylene glycol (MIRALAX / GLYCOLAX) packet Take 17 g by mouth daily.      SUMAtriptan (IMITREX) 25 MG tablet Take 25 mg by mouth every 2 (two) hours as needed for migraine. May repeat in 2 hours if headache persists or recurs.     valACYclovir  (VALTREX ) 500 MG tablet TAKE 1 TABLET BY MOUTH TWICE A DAY FOR 3 DAYS (LAST REFILL) 6 tablet 0   No current facility-administered medications for this visit.    Allergies  Allergen Reactions   Erythromycin Nausea And Vomiting   Penicillins Hives    Family History  Problem Relation Age of Onset   Hyperlipidemia Father    Melanoma Father        dx in his 3s   Hyperlipidemia Paternal Grandfather    Breast cancer Paternal Aunt        dbl mastectomy; dx in her 58s   Brain cancer Maternal Grandmother        dx in her 35s   Prostate cancer Maternal Grandfather 49   Ovarian cancer Paternal Grandmother        dx in her 28s    Social History   Socioeconomic History   Marital status: Married    Spouse name: Gaither   Number of children: 2   Years of education: Not on file   Highest education level: Not on file  Occupational History   Not on file  Tobacco Use  Smoking status: Former    Current packs/day: 0.00    Average packs/day: 1 pack/day for 10.0 years (10.0 ttl pk-yrs)    Types: Cigarettes    Start date: 08/27/1994    Quit date: 08/27/2004    Years since quitting: 19.3   Smokeless tobacco: Never  Vaping Use   Vaping status: Never Used  Substance and Sexual Activity   Alcohol use: Yes    Alcohol/week: 0.0 standard drinks of alcohol    Comment: 1-2 glasses/week   Drug use: No   Sexual activity: Yes    Birth control/protection: Pill  Other Topics Concern   Not on file  Social History Narrative   Not on file   Social Drivers of Health   Financial Resource Strain: Not on file  Food Insecurity: Not on file  Transportation Needs: Not on file  Physical Activity: Not on file  Stress: Not on file  Social Connections: Not on file  Intimate Partner Violence: Not on file     Constitutional: Patient  reports intermittent headaches.  Denies fever, malaise, fatigue, or abrupt weight changes.  HEENT: Denies eye pain, eye redness, ear pain, ringing in the ears, wax buildup, runny nose, nasal congestion, bloody nose, or sore throat. Respiratory: Denies difficulty breathing, shortness of breath, cough or sputum production.   Cardiovascular: Denies chest pain, chest tightness, palpitations or swelling in the hands or feet.  Gastrointestinal: Patient has a history of constipation.  Denies abdominal pain, bloating, diarrhea or blood in the stool.  GU: Denies urgency, frequency, pain with urination, burning sensation, blood in urine, odor or discharge. Musculoskeletal: Pt reports pain at the base of the left thumb. Denies decrease in range of motion, difficulty with gait, muscle pain or joint swelling.  Skin: Denies redness, rashes, lesions or ulcercations.  Neurological: Denies dizziness, difficulty with memory, difficulty with speech or problems with balance and coordination.  Psych: Patient has a history of anxiety.  Denies depression, SI/HI.  No other specific complaints in a complete review of systems (except as listed in HPI above).  Objective:   Physical Exam  BP 110/68 (BP Location: Left Arm, Patient Position: Sitting, Cuff Size: Normal)   Ht 5' 7.5 (1.715 m)   Wt 175 lb 3.2 oz (79.5 kg)   BMI 27.04 kg/m    Wt Readings from Last 3 Encounters:  01/15/23 160 lb (72.6 kg)  10/30/22 170 lb (77.1 kg)  05/15/22 165 lb (74.8 kg)    General: Appears her stated age, overweight, in NAD. Skin: Warm, dry and intact.  HEENT: Head: normal shape and size; Eyes: sclera white, no icterus, conjunctiva pink, PERRLA and EOMs intact;  Neck:  Neck supple, trachea midline. No masses, lumps or thyromegaly present.  Cardiovascular: Normal rate and rhythm. S1,S2 noted.  No murmur, rubs or gallops noted. No JVD or BLE edema.  Pulmonary/Chest: Normal effort and positive vesicular breath sounds. No  respiratory distress. No wheezes, rales or ronchi noted.  Abdomen:  Normal bowel sounds. Musculoskeletal: Pain with palpation at the base of the left thumb. Strength 5/5 BUE/BLE.  No difficulty with gait.  Neurological: Alert and oriented. Cranial nerves II-XII grossly intact. Coordination normal.  Psychiatric: Mood and affect normal. Behavior is normal. Judgment and thought content normal.     BMET    Component Value Date/Time   NA 138 10/30/2022 0823   K 4.2 10/30/2022 0823   CL 105 10/30/2022 0823   CO2 24 10/30/2022 0823   GLUCOSE 87 10/30/2022 0823   BUN  10 10/30/2022 0823   CREATININE 0.79 10/30/2022 0823   CALCIUM  9.1 10/30/2022 0823   GFRNONAA >60 05/15/2022 1018    Lipid Panel     Component Value Date/Time   CHOL 144 10/30/2022 0823   TRIG 143 10/30/2022 0823   HDL 76 10/30/2022 0823   CHOLHDL 1.9 10/30/2022 0823   VLDL 21.4 09/10/2019 0838   LDLCALC 45 10/30/2022 0823    CBC    Component Value Date/Time   WBC 5.7 10/30/2022 0823   RBC 4.00 10/30/2022 0823   HGB 12.6 10/30/2022 0823   HCT 37.3 10/30/2022 0823   PLT 244 10/30/2022 0823   MCV 93.3 10/30/2022 0823   MCH 31.5 10/30/2022 0823   MCHC 33.8 10/30/2022 0823   RDW 11.4 10/30/2022 0823   LYMPHSABS 2.7 12/02/2019 0859   MONOABS 0.4 12/02/2019 0859   EOSABS 0.0 12/02/2019 0859   BASOSABS 0.0 12/02/2019 0859    Hgb A1C Lab Results  Component Value Date   HGBA1C 4.7 08/24/2021            Assessment & Plan:   Preventative Health Maintenance:  Flu shot declined Tetanus UTD Encouraged her to get her COVID booster Pap smear UTD Mammogram UTD, will request copy Colonoscopy ordered by GYN Encouraged her to consume a balanced diet and exercise regimen Advised her to see an eye doctor and dentist annually We will check CBC, c-Met, lipid profile and A1c today  RTC in 1 year, for your annual exam Angeline Laura, NP

## 2023-12-30 NOTE — Assessment & Plan Note (Signed)
 She will try tumeric

## 2023-12-30 NOTE — Patient Instructions (Signed)

## 2023-12-30 NOTE — Assessment & Plan Note (Signed)
 Continue Imitrex as needed

## 2023-12-30 NOTE — Assessment & Plan Note (Signed)
Continue MiraLAX daily Encourage high-fiber diet and adequate water intake

## 2023-12-31 LAB — CBC
HCT: 39.4 % (ref 35.0–45.0)
Hemoglobin: 13.2 g/dL (ref 11.7–15.5)
MCH: 31 pg (ref 27.0–33.0)
MCHC: 33.5 g/dL (ref 32.0–36.0)
MCV: 92.5 fL (ref 80.0–100.0)
MPV: 10.6 fL (ref 7.5–12.5)
Platelets: 228 10*3/uL (ref 140–400)
RBC: 4.26 10*6/uL (ref 3.80–5.10)
RDW: 11.9 % (ref 11.0–15.0)
WBC: 6.5 10*3/uL (ref 3.8–10.8)

## 2023-12-31 LAB — COMPLETE METABOLIC PANEL WITH GFR
AG Ratio: 1.4 (calc) (ref 1.0–2.5)
ALT: 16 U/L (ref 6–29)
AST: 17 U/L (ref 10–35)
Albumin: 4.3 g/dL (ref 3.6–5.1)
Alkaline phosphatase (APISO): 49 U/L (ref 31–125)
BUN/Creatinine Ratio: 12 (calc) (ref 6–22)
BUN: 13 mg/dL (ref 7–25)
CO2: 23 mmol/L (ref 20–32)
Calcium: 9.4 mg/dL (ref 8.6–10.2)
Chloride: 104 mmol/L (ref 98–110)
Creat: 1.13 mg/dL — ABNORMAL HIGH (ref 0.50–0.99)
Globulin: 3 g/dL (ref 1.9–3.7)
Glucose, Bld: 88 mg/dL (ref 65–139)
Potassium: 4.5 mmol/L (ref 3.5–5.3)
Sodium: 137 mmol/L (ref 135–146)
Total Bilirubin: 0.5 mg/dL (ref 0.2–1.2)
Total Protein: 7.3 g/dL (ref 6.1–8.1)
eGFR: 61 mL/min/{1.73_m2} (ref >=60)

## 2023-12-31 LAB — LIPID PANEL
Cholesterol: 167 mg/dL (ref <200)
HDL: 71 mg/dL (ref >=50)
LDL Cholesterol (Calc): 65 mg/dL
Non-HDL Cholesterol (Calc): 96 mg/dL (ref <130)
Total CHOL/HDL Ratio: 2.4 (calc) (ref <5.0)
Triglycerides: 294 mg/dL — ABNORMAL HIGH (ref <150)

## 2023-12-31 LAB — HEMOGLOBIN A1C
Hgb A1c MFr Bld: 5.2 %{Hb} (ref <5.7)
Mean Plasma Glucose: 103 mg/dL
eAG (mmol/L): 5.7 mmol/L

## 2024-01-01 ENCOUNTER — Encounter: Payer: Self-pay | Admitting: Internal Medicine

## 2024-01-10 ENCOUNTER — Other Ambulatory Visit: Payer: Self-pay | Admitting: Internal Medicine

## 2024-01-10 NOTE — Telephone Encounter (Signed)
Requested medication (s) are due for refill today: request for 64 DS  Requested medication (s) are on the active medication list: yes  Last refill:  12/30/23 #30/0  Future visit scheduled: no pt just CPE  Notes to clinic:  rx was for 3 days and last refill, unsure if ok to approve refill.      Requested Prescriptions  Pending Prescriptions Disp Refills   valACYclovir (VALTREX) 500 MG tablet [Pharmacy Med Name: VALACYCLOVIR HCL 500 MG TABLET] 180 tablet 1    Sig: TAKE 1 TABLET BY MOUTH TWICE A DAY FOR 3 DAYS (LAST REFILL)     Antimicrobials:  Antiviral Agents - Anti-Herpetic Passed - 01/10/2024 12:44 PM      Passed - Valid encounter within last 12 months    Recent Outpatient Visits           1 week ago Encounter for general adult medical examination with abnormal findings   [place] [facility] [place], Salvadore Oxford, NP   1 year ago Encounter for general adult medical examination with abnormal findings   [facility] [place], Salvadore Oxford, NP   2 years ago Encounter for general adult medical examination with abnormal findings   [place] [facility] [place], Salvadore Oxford, NP   6 years ago Lower respiratory infection   Primary Care at [facility], [place], [place]

## 2024-02-05 ENCOUNTER — Encounter: Payer: Self-pay | Admitting: Internal Medicine

## 2024-02-24 ENCOUNTER — Telehealth: Admitting: Family Medicine

## 2024-02-24 DIAGNOSIS — J019 Acute sinusitis, unspecified: Secondary | ICD-10-CM | POA: Diagnosis not present

## 2024-02-24 DIAGNOSIS — B9689 Other specified bacterial agents as the cause of diseases classified elsewhere: Secondary | ICD-10-CM

## 2024-02-24 MED ORDER — AZITHROMYCIN 250 MG PO TABS: ORAL_TABLET | ORAL | 0 refills | Status: AC

## 2024-02-24 MED ORDER — PROMETHAZINE-DM 6.25-15 MG/5ML PO SYRP: 5.0000 mL | ORAL_SOLUTION | Freq: Four times a day (QID) | ORAL | 0 refills | Status: DC | PRN

## 2024-02-24 NOTE — Patient Instructions (Addendum)
 Lindsay Sharp, thank you for joining Freddy Finner, NP for today's virtual visit.  While this provider is not your primary care provider (PCP), if your PCP is located in our provider database this encounter information will be shared with them immediately following your visit.   A [facility] MyChart account gives you access to today's visit and all your visits, tests, and labs performed at [facility] " click here if you don't have a [facility] MyChart account or go to [email or web address]  Consent: (Patient) Lindsay Sharp provided verbal consent for this virtual visit at the beginning of the encounter.  Current Medications:  Current Outpatient Medications:    azithromycin (ZITHROMAX) 250 MG tablet, Take 2 tablets on day 1, then 1 tablet daily on days 2 through 5, Disp: 6 tablet, Rfl: 0   promethazine-dextromethorphan (PROMETHAZINE-DM) 6.25-15 MG/5ML syrup, Take 5 mLs by mouth 4 (four) times daily as needed for cough., Disp: 118 mL, Rfl: 0   atorvastatin (LIPITOR) 20 MG tablet, Take 1 tablet (20 mg total) by mouth daily., Disp: 90 tablet, Rfl: 0   Coenzyme Q10 (CO Q 10) 100 MG CAPS, Take 1 capsule by mouth daily., Disp: , Rfl:    NIKKI 3-0.02 MG tablet, Take 1 tablet by mouth daily., Disp: , Rfl:    Omega-3 Fatty Acids (FISH OIL) 1000 MG CAPS, Take 2,000 mg by mouth daily., Disp: , Rfl:    polyethylene glycol (MIRALAX / GLYCOLAX) packet, Take 17 g by mouth daily., Disp: , Rfl:    SUMAtriptan (IMITREX) 25 MG tablet, Take 25 mg by mouth every 2 (two) hours as needed for migraine. May repeat in 2 hours if headache persists or recurs. (Patient not taking: Reported on 12/30/2023), Disp: , Rfl:    valACYclovir (VALTREX) 500 MG tablet, TAKE 1 TABLET BY MOUTH TWICE A DAY FOR 3 DAYS (LAST REFILL), Disp: 180 tablet, Rfl: 1   Medications ordered in this encounter:  Meds ordered this encounter  Medications   azithromycin (ZITHROMAX) 250 MG tablet    Sig: Take 2 tablets on day 1,  then 1 tablet daily on days 2 through 5    Dispense:  6 tablet    Refill:  0    Supervising Provider:   Merrilee Jansky [[id number]]   promethazine-dextromethorphan (PROMETHAZINE-DM) 6.25-15 MG/5ML syrup    Sig: Take 5 mLs by mouth 4 (four) times daily as needed for cough.    Dispense:  118 mL    Refill:  0    Supervising Provider:   Merrilee Jansky [[id number]]     *If you need refills on other medications prior to your next appointment, please contact your pharmacy*  Follow-Up: Call back or seek an in-person evaluation if the symptoms worsen or if the condition fails to improve as anticipated.  [facility] [phone]  Other Instructions - Increased rest - Increasing Fluids - Acetaminophen / ibuprofen as needed for fever/pain.  - Salt water gargling, chloraseptic spray and throat lozenges - Mucinex if mucus is present and increasing.  - Saline nasal spray if congestion or if nasal passages feel dry. - Humidifying the air.    If you have been instructed to have an in-person evaluation today at a local Urgent Care facility, please use the link below. It will take you to a list of all of our available [facility], including address, phone number and hours of operation. Please do not delay care.  [facility]  If you or a family member do not have a primary care provider, use the link below to schedule a visit and establish care. When you choose a [facility] primary care physician or advanced practice provider, you gain a long-term partner in health. Find a Primary Care Provider  Learn more about [facility]'s in-office and virtual care options: [facility] - Get Care Now

## 2024-02-24 NOTE — Progress Notes (Signed)
 Virtual Visit Consent   Lindsay Sharp, you are scheduled for a virtual visit with a [facility] provider today. Just as with appointments in the office, your consent must be obtained to participate. Your consent will be active for this visit and any virtual visit you may have with one of our providers in the next 365 days. If you have a MyChart account, a copy of this consent can be sent to you electronically.  As this is a virtual visit, video technology does not allow for your provider to perform a traditional examination. This may limit your provider's ability to fully assess your condition. If your provider identifies any concerns that need to be evaluated in person or the need to arrange testing (such as labs, EKG, etc.), we will make arrangements to do so. Although advances in technology are sophisticated, we cannot ensure that it will always work on either your end or our end. If the connection with a video visit is poor, the visit may have to be switched to a telephone visit. With either a video or telephone visit, we are not always able to ensure that we have a secure connection.  By engaging in this virtual visit, you consent to the provision of healthcare and authorize for your insurance to be billed (if applicable) for the services provided during this visit. Depending on your insurance coverage, you may receive a charge related to this service.  I need to obtain your verbal consent now. Are you willing to proceed with your visit today? Lindsay Sharp has provided verbal consent on 02/24/2024 for a virtual visit (video or telephone). Freddy Finner, NP  Date: 02/24/2024 3:03 PM   Virtual Visit via Video Note   I, Freddy Finner, connected with  Lindsay Sharp  ([id number], 1976-02-04) on 02/24/24 at  3:00 PM EST by a video-enabled telemedicine application and verified that I am speaking with the correct person using two identifiers.  Location: Patient: Virtual Visit Location Patient:  Home Provider: Virtual Visit Location Provider: Home Office   I discussed the limitations of evaluation and management by telemedicine and the availability of in person appointments. The patient expressed understanding and agreed to proceed.    History of Present Illness: Lindsay Sharp is a 48 y.o. who identifies as a female who was assigned female at birth, and is being seen today for sinus infection  Onset was over 7 days ago- Dx with viral URI - OTC treatments  Associated symptoms are cough, congestion in sinuses and pressure and chest  Modifying factors are emergent C, 600 mg ibuprofen,  Denies chest pain, shortness of breath, fevers, chills  Exposure to sick contacts- unknown  Problems:  Patient Active Problem List   Diagnosis Date Noted   Arthritis of carpometacarpal [facility]) joint of left thumb 12/30/2023   Postcoital UTI 10/30/2022   Chronic constipation 10/30/2022   Overweight with body mass index (BMI) of 27 to 27.9 in adult 10/30/2022   Anxiety 09/10/2019   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   Hyperlipidemia 08/27/2014    Allergies:  Allergies  Allergen Reactions   Erythromycin Nausea And Vomiting   Penicillins Hives   Medications:  Current Outpatient Medications:    atorvastatin (LIPITOR) 20 MG tablet, Take 1 tablet (20 mg total) by mouth daily., Disp: 90 tablet, Rfl: 0   Coenzyme Q10 (CO Q 10) 100 MG CAPS, Take 1 capsule by mouth daily., Disp: , Rfl:    NIKKI 3-0.02 MG tablet, Take 1 tablet by  mouth daily., Disp: , Rfl:    Omega-3 Fatty Acids (FISH OIL) 1000 MG CAPS, Take 2,000 mg by mouth daily., Disp: , Rfl:    polyethylene glycol (MIRALAX / GLYCOLAX) packet, Take 17 g by mouth daily., Disp: , Rfl:    SUMAtriptan (IMITREX) 25 MG tablet, Take 25 mg by mouth every 2 (two) hours as needed for migraine. May repeat in 2 hours if headache persists or recurs. (Patient not taking: Reported on 12/30/2023), Disp: , Rfl:    valACYclovir (VALTREX) 500 MG tablet, TAKE 1 TABLET BY MOUTH  TWICE A DAY FOR 3 DAYS (LAST REFILL), Disp: 180 tablet, Rfl: 1  Observations/Objective: Patient is well-developed, well-nourished in no acute distress.  Resting comfortably  at home.  Head is normocephalic, atraumatic.  No labored breathing.  Speech is clear and coherent with logical content.  Patient is alert and oriented at baseline.  Congestion  Cough   Assessment and Plan:   1. Acute bacterial sinusitis (Primary)  - azithromycin (ZITHROMAX) 250 MG tablet; Take 2 tablets on day 1, then 1 tablet daily on days 2 through 5  Dispense: 6 tablet; Refill: 0 - promethazine-dextromethorphan (PROMETHAZINE-DM) 6.25-15 MG/5ML syrup; Take 5 mLs by mouth 4 (four) times daily as needed for cough.  Dispense: 118 mL; Refill: 0   - Increased rest - Increasing Fluids - Acetaminophen / ibuprofen as needed for fever/pain.  - Salt water gargling, chloraseptic spray and throat lozenges - Mucinex if mucus is present and increasing.  - Saline nasal spray if congestion or if nasal passages feel dry. - Humidifying the air.   Reviewed side effects, risks and benefits of medication.    Patient acknowledged agreement and understanding of the plan.   Past Medical, Surgical, Social History, Allergies, and Medications have been Reviewed.    Follow Up Instructions: I discussed the assessment and treatment plan with the patient. The patient was provided an opportunity to ask questions and all were answered. The patient agreed with the plan and demonstrated an understanding of the instructions.  A copy of instructions were sent to the patient via MyChart unless otherwise noted below.    The patient was advised to call back or seek an in-person evaluation if the symptoms worsen or if the condition fails to improve as anticipated.    Freddy Finner, NP

## 2024-03-06 ENCOUNTER — Other Ambulatory Visit: Payer: Self-pay | Admitting: Internal Medicine

## 2024-03-06 NOTE — Telephone Encounter (Signed)
 Requested Prescriptions  Pending Prescriptions Disp Refills   atorvastatin (LIPITOR) 20 MG tablet [Pharmacy Med Name: ATORVASTATIN 20 MG TABLET] 90 tablet 0    Sig: TAKE 1 TABLET BY MOUTH EVERY DAY     Cardiovascular:  Antilipid - Statins Failed - 03/06/2024  2:47 PM      Failed - Lipid Panel in normal range within the last 12 months    Cholesterol  Date Value Ref Range Status  12/30/2023 167 <200 mg/dL Final   LDL Cholesterol (Calc)  Date Value Ref Range Status  12/30/2023 65 mg/dL (calc) Final    Comment:    Reference range: <100 . Desirable range <100 mg/dL for primary prevention;   <70 mg/dL for patients with CHD or diabetic patients  with > or = 2 CHD risk factors. Marland Kitchen LDL-C is now calculated using the Martin-Hopkins  calculation, which is a validated novel method providing  better accuracy than the Friedewald equation in the  estimation of LDL-C.  Horald Pollen et al. Lenox Ahr. 1610;960(45): 2061-2068  (http://education.[facility].com/faq/FAQ164)    HDL  Date Value Ref Range Status  12/30/2023 71 > OR = 50 mg/dL Final   Triglycerides  Date Value Ref Range Status  12/30/2023 294 (H) <150 mg/dL Final    Comment:    . If a non-fasting specimen was collected, consider repeat triglyceride testing on a fasting specimen if clinically indicated.  Perry Mount et al. J. of Clin. Lipidol. 2015;9:129-169. Marland Kitchen          Passed - Patient is not pregnant      Passed - Valid encounter within last 12 months    Recent Outpatient Visits           2 months ago Encounter for general adult medical examination with abnormal findings   [place] [facility] [place], Salvadore Oxford, NP   1 year ago Encounter for general adult medical examination with abnormal findings   [facility] [place], Salvadore Oxford, NP   2 years ago Encounter for general adult medical examination with abnormal findings    [facility] [place], Salvadore Oxford, NP   6  years ago Lower respiratory infection   Primary Care at [facility], [place], [place]

## 2024-06-06 ENCOUNTER — Other Ambulatory Visit: Payer: Self-pay | Admitting: Internal Medicine

## 2024-06-09 NOTE — Telephone Encounter (Signed)
 Requested medication (s) are due for refill today: yes   Requested medication (s) are on the active medication list: yes   Last refill:  03/06/24 #90 0 refills  Future visit scheduled: no   Notes to clinic:  last OV 12/30/23. No refills remain. Do you want to refill Rx?     Requested Prescriptions  Pending Prescriptions Disp Refills   atorvastatin  (LIPITOR) 20 MG tablet [Pharmacy Med Name: ATORVASTATIN  20 MG TABLET] 90 tablet 0    Sig: TAKE 1 TABLET BY MOUTH EVERY DAY     Cardiovascular:  Antilipid - Statins Failed - 06/09/2024  9:47 AM      Failed - Valid encounter within last 12 months    Recent Outpatient Visits           6 years ago Lower respiratory infection   Primary Care at [facility], [place], [place]              Failed - Lipid Panel in normal range within the last 12 months    Cholesterol  Date Value Ref Range Status  12/30/2023 167 <200 mg/dL Final   LDL Cholesterol (Calc)  Date Value Ref Range Status  12/30/2023 65 mg/dL (calc) Final    Comment:    Reference range: <100 . Desirable range <100 mg/dL for primary prevention;   <70 mg/dL for patients with CHD or diabetic patients  with > or = 2 CHD risk factors. Aaron Aas LDL-C is now calculated using the Martin-Hopkins  calculation, which is a validated novel method providing  better accuracy than the Friedewald equation in the  estimation of LDL-C.  Melinda Sprawls et al. Erroll Heard. 6045;409(81): 2061-2068  (http://education.[facility].com/faq/FAQ164)    HDL  Date Value Ref Range Status  12/30/2023 71 > OR = 50 mg/dL Final   Triglycerides  Date Value Ref Range Status  12/30/2023 294 (H) <150 mg/dL Final    Comment:    . If a non-fasting specimen was collected, consider repeat triglyceride testing on a fasting specimen if clinically indicated.  Imagene Mam et al. J. of Clin. Lipidol. 2015;9:129-169. Aaron Aas          Passed - Patient is not pregnant

## 2024-08-14 ENCOUNTER — Telehealth: Payer: Self-pay

## 2024-08-14 NOTE — Transitions of Care (Post Inpatient/ED Visit) (Signed)
   08/14/2024  Name: Lindsay Sharp MRN: [id number] DOB: 08-16-1976  Today's TOC FU Call Status: Today's TOC FU Call Status:: Successful TOC FU Call Completed TOC FU Call Complete Date: 08/14/24 Patient's Name and Date of Birth confirmed.  Transition Care Management Follow-up Telephone Call Date of Discharge: 08/13/24 Discharge Facility: Other [facility]) Name of Other ([facility]) Discharge Facility: [facility] Type of Discharge: Inpatient Admission Primary Inpatient Discharge Diagnosis:: NSTEMi How have you been since you were released from the hospital?: Better Any questions or concerns?: No  Items Reviewed: Did you receive and understand the discharge instructions provided?: Yes Medications obtained,verified, and reconciled?: Yes (Medications Reviewed) Any new allergies since your discharge?: No Dietary orders reviewed?: Yes Do you have support at home?: Yes People in Home [RPT]: spouse  Medications Reviewed Today: Medications Reviewed Today     Reviewed by Emmitt Pan, LPN (Licensed Practical Nurse) on 08/14/24 at [phone]  Med List Status: <None>   Medication Order Taking? Sig Documenting Provider Last Dose Status Informant  atorvastatin  (LIPITOR) 20 MG tablet [id number] Yes TAKE 1 TABLET BY MOUTH EVERY DAY Baity, Angeline ORN, NP  Active   Coenzyme Q10 (CO Q 10) 100 MG CAPS [id number] Yes Take 1 capsule by mouth daily. [provider]  Active   metoprolol succinate (TOPROL-XL) 25 MG 24 hr tablet [id number] Yes Take 25 mg by mouth daily. [provider]  Active   NIKKI 3-0.02 MG tablet [id number] Yes Take 1 tablet by mouth daily. [provider]  Active   Omega-3 Fatty Acids (FISH OIL) 1000 MG CAPS [id number] Yes Take 2,000 mg by mouth daily. [provider]  Active   polyethylene glycol [facility] / GLYCOLAX) packet [id number] Yes Take 17 g by mouth daily. [provider]  Active   promethazine -dextromethorphan (PROMETHAZINE -DM) 6.25-15  MG/5ML syrup [id number] Yes Take 5 mLs by mouth 4 (four) times daily as needed for cough. Moishe Chiquita HERO, NP  Active   SUMAtriptan (IMITREX) 25 MG tablet [id number]  Take 25 mg by mouth every 2 (two) hours as needed for migraine. May repeat in 2 hours if headache persists or recurs.  Patient not taking: Reported on 08/14/2024   [provider]  Active   valACYclovir  (VALTREX ) 500 MG tablet [id number] Yes TAKE 1 TABLET BY MOUTH TWICE A DAY FOR 3 DAYS (LAST REFILL) Antonette Angeline ORN, NP  Active             Home Care and Equipment/Supplies: Were Home Health Services Ordered?: NA Any new equipment or medical supplies ordered?: NA  Functional Questionnaire: Do you need assistance with bathing/showering or dressing?: No Do you need assistance with meal preparation?: No Do you need assistance with eating?: No Do you have difficulty maintaining continence: No Do you need assistance with getting out of bed/getting out of a chair/moving?: No Do you have difficulty managing or taking your medications?: No  Follow up appointments reviewed: PCP Follow-up appointment confirmed?: Yes Date of PCP follow-up appointment?: 08/18/24 Follow-up Provider: [facility] Follow-up appointment confirmed?: No Reason Specialist Follow-Up Not Confirmed: Patient has Specialist Provider Number and will Call for Appointment Do you need transportation to your follow-up appointment?: No Do you understand care options if your condition(s) worsen?: Yes-patient verbalized understanding    SIGNATURE Pan Emmitt, LPN [facility] Nurse Health Advisor Direct Dial [phone]

## 2024-08-14 NOTE — Telephone Encounter (Signed)
 Will discuss at upcoming appt.

## 2024-08-18 ENCOUNTER — Encounter: Payer: Self-pay | Admitting: Internal Medicine

## 2024-08-18 ENCOUNTER — Ambulatory Visit: Admitting: Internal Medicine

## 2024-08-18 VITALS — BP 128/72 | HR 59 | Ht 67.5 in | Wt 170.4 lb

## 2024-08-18 DIAGNOSIS — F419 Anxiety disorder, unspecified: Secondary | ICD-10-CM | POA: Diagnosis not present

## 2024-08-18 DIAGNOSIS — I214 Non-ST elevation (NSTEMI) myocardial infarction: Secondary | ICD-10-CM

## 2024-08-18 DIAGNOSIS — E782 Mixed hyperlipidemia: Secondary | ICD-10-CM

## 2024-08-18 DIAGNOSIS — I252 Old myocardial infarction: Secondary | ICD-10-CM | POA: Insufficient documentation

## 2024-08-18 MED ORDER — METOPROLOL SUCCINATE ER 25 MG PO TB24: 25.0000 mg | ORAL_TABLET | Freq: Every day | ORAL | 0 refills | Status: DC

## 2024-08-18 MED ORDER — ATORVASTATIN CALCIUM 20 MG PO TABS: 20.0000 mg | ORAL_TABLET | Freq: Every day | ORAL | 0 refills | Status: DC

## 2024-08-18 NOTE — Progress Notes (Signed)
 Subjective:    Patient ID: Lindsay Sharp, female    DOB: [birth year], 48 y.o.   MRN: [id number]  HPI  Discussed the use of AI scribe software for clinical note transcription with the patient, who gave verbal consent to proceed.  Lindsay Sharp is a 48 year old female who presents to the clinic today for [facility] followup for chest pain and recent NSTEMI.  On August 19th, she experienced chest pain at work, described as different from previous episodes of reflux. The pain was persistent, and her blood pressure was recorded at 175 systolic. EMS was called, and she was given medication to lower her blood pressure. She opted to be driven to the hospital by her bookkeeper.  At the hospital, her troponin levels peaked at 2108, leading to a diagnosis of NSTEMI. ECG did not show any significant findings. Chest xray, CTA chest and echocardiogram were performed. All were normal except wall motion abnormalities in the RV apex and LV inferior wall. Her EF > 55%.  A cardiac catheterization on August 20th did not reveal significant blockages. They felt the NSTEMI was caused by vasospasm. She was discharged on August 21st with prescriptions for metoprolol  and nitroglycerin.  Since discharge, she feels mostly normal but experiences discomfort on the left side under her breast, particularly when lying flat, which subsides when lying on her right side. Her current medications include metoprolol , atorvastatin , and nitroglycerin as needed.  She has a high-stress job and recent personal stressors, including her daughter leaving for college. No lightheadedness, dizziness, or weakness since the event.       Review of Systems     Past Medical History:  Diagnosis Date   Chest pain    Chicken pox    Endometriosis    Family history of breast cancer    Family history of ovarian cancer    Hyperlipidemia     Current Outpatient Medications  Medication Sig Dispense Refill   atorvastatin  (LIPITOR) 20 MG  tablet TAKE 1 TABLET BY MOUTH EVERY DAY 90 tablet 0   Coenzyme Q10 (CO Q 10) 100 MG CAPS Take 1 capsule by mouth daily.     metoprolol  succinate (TOPROL -XL) 25 MG 24 hr tablet Take 25 mg by mouth daily.     NIKKI 3-0.02 MG tablet Take 1 tablet by mouth daily.     Omega-3 Fatty Acids (FISH OIL) 1000 MG CAPS Take 2,000 mg by mouth daily.     polyethylene glycol (MIRALAX / GLYCOLAX) packet Take 17 g by mouth daily.     promethazine -dextromethorphan (PROMETHAZINE -DM) 6.25-15 MG/5ML syrup Take 5 mLs by mouth 4 (four) times daily as needed for cough. 118 mL 0   SUMAtriptan (IMITREX) 25 MG tablet Take 25 mg by mouth every 2 (two) hours as needed for migraine. May repeat in 2 hours if headache persists or recurs. (Patient not taking: Reported on 08/14/2024)     valACYclovir  (VALTREX ) 500 MG tablet TAKE 1 TABLET BY MOUTH TWICE A DAY FOR 3 DAYS (LAST REFILL) 180 tablet 1   No current facility-administered medications for this visit.    Allergies  Allergen Reactions   Erythromycin Nausea And Vomiting   Penicillins Hives    Family History  Problem Relation Age of Onset   Hyperlipidemia Father    Melanoma Father        dx in his 33s   Hyperlipidemia Paternal Grandfather    Breast cancer Paternal Aunt        dbl mastectomy; dx  in her 56s   Brain cancer Maternal Grandmother        dx in her 56s   Prostate cancer Maternal Grandfather 50   Ovarian cancer Paternal Grandmother        dx in her 56s    Social History   Socioeconomic History   Marital status: Married    Spouse name: Gaither   Number of children: 2   Years of education: Not on file   Highest education level: Not on file  Occupational History   Not on file  Tobacco Use   Smoking status: Former    Current packs/day: 0.00    Average packs/day: 1 pack/day for 10.0 years (10.0 ttl pk-yrs)    Types: Cigarettes    Start date: 08/27/1994    Quit date: 08/27/2004    Years since quitting: 19.9   Smokeless tobacco: Never  Vaping Use    Vaping status: Never Used  Substance and Sexual Activity   Alcohol use: Yes    Alcohol/week: 0.0 standard drinks of alcohol    Comment: 1-2 glasses/week   Drug use: No   Sexual activity: Yes    Birth control/protection: Pill  Other Topics Concern   Not on file  Social History Narrative   Not on file   Social Drivers of Health   Financial Resource Strain: Low Risk  (08/17/2024)   Received from [facility]   Overall Financial Resource Strain (CARDIA)    Difficulty of Paying Living Expenses: Not hard at all  Food Insecurity: No Food Insecurity (08/17/2024)   Received from [facility]   Hunger Vital Sign    Within the past 12 months, you worried that your food would run out before you got the money to buy more.: Never true    Within the past 12 months, the food you bought just didn't last and you didn't have money to get more.: Never true  Transportation Needs: No Transportation Needs (08/17/2024)   Received from [facility] - Transportation    In the past 12 months, has lack of transportation kept you from medical appointments or from getting medications?: No    Lack of Transportation (Non-Medical): No  Physical Activity: Not on file  Stress: Not on file  Social Connections: Not on file  Intimate Partner Violence: Not on file     Constitutional: Patient reports intermittent headaches.  Denies fever, malaise, fatigue, or abrupt weight changes.  HEENT: Denies eye pain, eye redness, ear pain, ringing in the ears, wax buildup, runny nose, nasal congestion, bloody nose, or sore throat. Respiratory: Denies difficulty breathing, shortness of breath, cough or sputum production.   Cardiovascular: Pt reports left side chest discomfort. Denies chest tightness, palpitations or swelling in the hands or feet.  Gastrointestinal: Patient has a history of constipation.  Denies abdominal pain, bloating, diarrhea or blood in the stool.   GU: Denies urgency, frequency, pain with urination, burning sensation, blood in urine, odor or discharge. Musculoskeletal:  Denies decrease in range of motion, difficulty with gait, muscle pain or joint swelling.  Skin: Denies redness, rashes, lesions or ulcercations.  Neurological: Denies dizziness, difficulty with memory, difficulty with speech or problems with balance and coordination.  Psych: Patient has a history of anxiety.  Denies depression, SI/HI.  No other specific complaints in a complete review of systems (except as listed in HPI above).  Objective:   Physical Exam  BP 128/72 (BP Location: Left Arm, Patient Position: Sitting,  Cuff Size: Normal)   Pulse (!) 59   Ht 5' 7.5 (1.715 m)   Wt 170 lb 6.4 oz (77.3 kg)   LMP 08/16/2024 (Approximate) Comment: bleeding past 2 days  SpO2 100%   BMI 26.29 kg/m    Wt Readings from Last 3 Encounters:  12/30/23 175 lb 3.2 oz (79.5 kg)  01/15/23 160 lb (72.6 kg)  10/30/22 170 lb (77.1 kg)    General: Appears her stated age, overweight, in NAD. Skin: Warm, dry and intact.   Cardiovascular: Bradycardic with normal rhythm. S1,S2 noted.  No murmur, rubs or gallops noted.  Pulmonary/Chest: Normal effort and positive vesicular breath sounds. No respiratory distress. No wheezes, rales or ronchi noted.  Musculoskeletal: No difficulty with gait.  Neurological: Alert and oriented. Coordination normal.  Psychiatric: Mood and affect normal. Behavior is normal. Judgment and thought content normal.     BMET    Component Value Date/Time   NA 137 12/30/2023 1547   K 4.5 12/30/2023 1547   CL 104 12/30/2023 1547   CO2 23 12/30/2023 1547   GLUCOSE 88 12/30/2023 1547   BUN 13 12/30/2023 1547   CREATININE 1.13 (H) 12/30/2023 1547   CALCIUM  9.4 12/30/2023 1547   GFRNONAA >60 05/15/2022 1018    Lipid Panel     Component Value Date/Time   CHOL 167 12/30/2023 1547   TRIG 294 (H) 12/30/2023 1547   HDL 71 12/30/2023 1547   CHOLHDL 2.4  12/30/2023 1547   VLDL 21.4 09/10/2019 0838   LDLCALC 65 12/30/2023 1547    CBC    Component Value Date/Time   WBC 6.5 12/30/2023 1547   RBC 4.26 12/30/2023 1547   HGB 13.2 12/30/2023 1547   HCT 39.4 12/30/2023 1547   PLT 228 12/30/2023 1547   MCV 92.5 12/30/2023 1547   MCH 31.0 12/30/2023 1547   MCHC 33.5 12/30/2023 1547   RDW 11.9 12/30/2023 1547   LYMPHSABS 2.7 12/02/2019 0859   MONOABS 0.4 12/02/2019 0859   EOSABS 0.0 12/02/2019 0859   BASOSABS 0.0 12/02/2019 0859    Hgb A1C Lab Results  Component Value Date   HGBA1C 5.2 12/30/2023            Assessment & Plan:   Assessment and Plan    TCM hospital followup for 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 Cardiac catheterization showed no significant coronary artery disease. Echocardiogram revealed normal ejection fraction with subtle focal wall motion abnormalities in the RV apex and LV inferior wall, possibly related to vasospasm. Currently on metoprolol  and nitroglycerin as needed. No significant chest pain since discharge, but discomfort when lying on the left side. [facility] notes, labs and imaging reviewed - Continue metoprolol  25 mg daily until cardiology follow-up. - Use nitroglycerin as needed for chest pain. - Follow up with cardiology in October for repeat echocardiogram. - Avoid strenuous exercise until cardiology clearance. Begin with walking or light jogging, and stop if chest pain occurs.  Hyperlipidemia Hyperlipidemia is well-managed with atorvastatin . Recent cholesterol levels were within normal limits. - Continue atorvastatin  20 mg daily. Encouraged low fat diet  Anxiety related to work stress Anxiety related to work stress, exacerbated by recent events including daughter's departure for college and recent hospitalization. She prefers not to take daily medication  for anxiety and finds relief with evening relaxation and occasional alcohol consumption. - Recommend half-day work schedule for the next week to reduce  stress.      RTC in 5 months, for your annual exam Angeline Laura, NP

## 2024-08-18 NOTE — Patient Instructions (Signed)
 Heart Attack A heart attack happens when the flow of blood and oxygen to the heart is cut off. If not treated right away, a heart attack can cause lasting damage to the heart. A heart attack is also called a myocardial infarction. If you think you're having a heart attack, don't wait to see if the symptoms will go away. Get medical help right away. What are the causes? A heart attack may be caused by: A fatty substance called plaque. This can build up in blood vessels called arteries. It can block the flow of blood to the heart. A blood clot in the blood vessels that go to the heart. An abnormal heartbeat. Some diseases that lower the amount of oxygen in the blood. These include anemia and emphysema. Tightening of a blood vessel that cuts off blood to the heart. This is called a spasm. Other causes may include: Using drugs such as cocaine or methamphetamine. A tear in a blood vessel of the heart. What increases the risk? Aging. The risk gets higher as you get older. Having a family history of or any past problems with: Chest pain. Heart attack or stroke. Peripheral vascular disease. This is when the blood vessels in the legs, arms, or stomach get narrow. Taking chemotherapy or medicines to treat cancer. Being female. Being overweight or obese. Having any of these conditions: High blood pressure. High cholesterol. Diabetes. Doing any of these things: Drinking too much alcohol. Smoking. Not getting enough exercise. What are the signs or symptoms? Symptoms of a heart attack can vary, depending on things like your gender or age. Symptoms may include: Chest pain. It may feel like: Crushing or squeezing. Tightness, pressure, fullness, or heaviness. Pain in the arm, neck, jaw, back, or upper body. Heartburn or upset stomach. Shortness of breath. Feeling like you may vomit. Cold sweats. Feeling light-headed or dizzy all of a sudden. Or you may pass out. How is this diagnosed? A heart  attack may be diagnosed with: Imaging tests, such as: Electrocardiogram, or ECG. This records the electrical activity of your heart. CT scan. Coronary angiogram. For this test, dye is put into the body and X-rays are taken of the heart arteries. Echocardiogram. This test uses sound waves to make pictures of the heart. Blood tests. These are done to check for chemicals that are released by a damaged heart. How is this treated? A heart attack must be treated as soon as possible. Treatment may include: Medicines to: Break up or dissolve blood clots. Help prevent blood clots. Treat blood pressure. Improve blood flow to the heart. Help with pain. Angioplasty and stent placement. These are procedures to widen a blocked artery and keep it open. Open heart surgery, called coronary artery bypass grafting. This enables blood to flow to the heart by going around the blocked part of the artery. Follow these instructions at home: Medicines Take your medicines only as told by your health care provider. Do not take these medicines unless your provider says it's okay: NSAIDs, such as ibuprofen or naproxen . Any vitamins or supplements. Hormone replacement therapy. If you're taking blood thinners: Talk with your provider before taking aspirin or NSAIDs. Take your medicines as told. Take them at the same time each day. Do not do things that could hurt or bruise you. Be careful to avoid falls. Wear an alert bracelet or carry a card that says you take blood thinners. Lifestyle  Do not smoke, vape, or use products with nicotine or tobacco in them. If you  need help quitting, talk with your provider. Avoid secondhand smoke. Exercise often. Ask your provider about a cardiac rehab program. This program helps you to: Exercise safely. Learn ways to improve heart health and well-being as you recover. Eat foods that are healthy for your heart. Your provider will tell you what foods to eat. Stay at a healthy  weight. Learn ways to lower your stress level. Do not use illegal drugs. Alcohol use Do not drink alcohol if: Your provider tells you not to drink. You're pregnant, may be pregnant, or plan to become pregnant. If you drink alcohol: Limit how much you have to: 0-1 drink a day if you're female. 0-2 drinks a day if you're female. Know how much alcohol is in your drink. In the [place] one drink is one 12 oz bottle of beer (355 mL), one 5 oz glass of wine (148 mL), or one 1 oz glass of hard liquor (44 mL). General instructions Work with your provider to treat other problems you have, such as high blood pressure or diabetes. Get screened for depression. Get treatment if needed. Keep your vaccines up to date. Get the flu shot every year. Keep all follow-up visits. Your provider will check that your heart health is improving with treatments. Contact a health care provider if: You feel very sad. You have trouble doing your daily activities. You get light-headed or dizzy. You have blood pressure that is higher or lower than what your provider told you it should be. Get help right away if: You have sudden, unexplained discomfort in your chest, arms, back, neck, jaw, or upper body. You have sudden shortness of breath. You have sudden sweating or your skin is cold to the touch. You feel like you may vomit or you vomit. You feel your heart beating fast or skipping beats. These symptoms may be an emergency. Call 911 right away. Do not wait to see if the symptoms will go away. Do not drive yourself to the hospital. This information is not intended to replace advice given to you by your health care provider. Make sure you discuss any questions you have with your health care provider. Document Revised: 09/12/2023 Document Reviewed: 02/27/2023 Elsevier Patient Education  2024 [employer].

## 2024-09-07 ENCOUNTER — Encounter: Payer: Self-pay | Admitting: Gastroenterology

## 2024-09-10 ENCOUNTER — Telehealth: Payer: Self-pay

## 2024-09-10 NOTE — Telephone Encounter (Signed)
 Lindsay Sharp I had a chance to look at her chart - she had an NSTEMI thought to be due to vasospasm at the end of August. She has a planned repeat echo with her cardiologist in October. It looks like this is a screening colonoscopy. I'd recommend we delay this exam back a few months to make sure stable for a bit and she has time to see her cardiologist and get clearance for this. I would not want to do this any sooner than 3 months from her hospitalization and again if she has no bowel symptoms or anemia, may want to push this out a bit further. She should see her cardiologist in October and have a repeat echo and if they feel she can have it done, can contact us  for rescheduling at that time. If she has symptoms for which she thinks she needs a colonoscopy sooner then she needs to let us  know and be seen in the office first. Thanks

## 2024-09-10 NOTE — Telephone Encounter (Signed)
 Dr. Leigh, Please advise if this patient will need cardiac clearance or if she will need to postpone her procedure as she was recently seen for a NSTEMI (08/11/2024 admission at [facility]). I reviewed the chart and do not see where the patient has clearance for this procedure. Please/thank you Bre, PV RN

## 2024-09-10 NOTE — Telephone Encounter (Signed)
 Pt informed that GI MD and RN will be sending a requet for Cardiac Clearance before she can proceed with her colonoscopy.    Pt stats that she has a follow up scan on Oct 24th and has been told she has no restrictions. States she understands the need for clearance. No othr questions at this time.

## 2024-09-11 NOTE — Telephone Encounter (Signed)
 Informed pt that DR Leigh does not want to do the procedure until at least 3 months after her hospitalization. Pt denies any bowel issues at this time and states she understands the suggestion. Informed her that procedure and PV will be canceled and that once time frame has been made to call and reschedule. RN did suggest that she tell Card MD at time of Oct visit that we will need a cardiac clearance sent before she would have a colonoscopy. Pt states she will bring it up in the OV. No other questions at this time.

## 2024-09-30 ENCOUNTER — Encounter

## 2024-10-13 ENCOUNTER — Other Ambulatory Visit: Payer: Self-pay | Admitting: Internal Medicine

## 2024-10-13 DIAGNOSIS — Z1231 Encounter for screening mammogram for malignant neoplasm of breast: Secondary | ICD-10-CM

## 2024-10-20 ENCOUNTER — Encounter: Admitting: Gastroenterology

## 2024-11-03 ENCOUNTER — Ambulatory Visit
Admission: RE | Admit: 2024-11-03 | Discharge: 2024-11-03 | Disposition: A | Discharge status: Home / Self Care | Source: Ambulatory Visit | Attending: Internal Medicine | Admitting: Internal Medicine

## 2024-11-03 DIAGNOSIS — Z1231 Encounter for screening mammogram for malignant neoplasm of breast: Secondary | ICD-10-CM

## 2024-11-05 ENCOUNTER — Encounter: Payer: Self-pay | Admitting: Internal Medicine

## 2024-11-05 NOTE — Telephone Encounter (Signed)
 She will need to be seen for at least a virtual appointment before I can prescribe anything.

## 2024-11-19 ENCOUNTER — Other Ambulatory Visit: Payer: Self-pay | Admitting: Internal Medicine

## 2024-11-24 NOTE — Telephone Encounter (Signed)
 Requested Prescriptions  Pending Prescriptions Disp Refills   valACYclovir  (VALTREX ) 500 MG tablet [Pharmacy Med Name: VALACYCLOVIR  HCL 500 MG TABLET] 90 tablet 0    Sig: TAKE 1 TABLET BY MOUTH TWICE A DAY FOR 3 DAYS (LAST REFILL)     Antimicrobials:  Antiviral Agents - Anti-Herpetic Passed - 11/24/2024 11:23 AM      Passed - Valid encounter within last 12 months    Recent Outpatient Visits           3 months ago NSTEMI (non-ST elevated myocardial infarction) [facility])   [place] [facility] [place], [place] W, NP   6 years ago Lower respiratory infection   [facility] at [facility] [place], [place], PA-C               atorvastatin  (LIPITOR) 20 MG tablet [Pharmacy Med Name: ATORVASTATIN  20 MG TABLET] 90 tablet 0    Sig: TAKE 1 TABLET BY MOUTH EVERY DAY     Cardiovascular:  Antilipid - Statins Failed - 11/24/2024 11:23 AM      Failed - Lipid Panel in normal range within the last 12 months    Cholesterol  Date Value Ref Range Status  12/30/2023 167 <200 mg/dL Final   LDL Cholesterol (Calc)  Date Value Ref Range Status  12/30/2023 65 mg/dL (calc) Final    Comment:    Reference range: <100 . Desirable range <100 mg/dL for primary prevention;   <70 mg/dL for patients with CHD or diabetic patients  with > or = 2 CHD risk factors. SABRA LDL-C is now calculated using the Martin-Hopkins  calculation, which is a validated novel method providing  better accuracy than the Friedewald equation in the  estimation of LDL-C.  Gladis APPLETHWAITE et al. SANDREA. 7986;689(80): 2061-2068  (http://education.Quest[employer]ostics.com/faq/FAQ164)    HDL  Date Value Ref Range Status  12/30/2023 71 > OR = 50 mg/dL Final   Triglycerides  Date Value Ref Range Status  12/30/2023 294 (H) <150 mg/dL Final    Comment:    . If a non-fasting specimen was collected, consider repeat triglyceride testing on a fasting specimen if clinically indicated.  Veatrice et al. J. of Clin. Lipidol. 2015;9:129-169. SABRA           Passed - Patient is not pregnant      Passed - Valid encounter within last 12 months    Recent Outpatient Visits           3 months ago NSTEMI (non-ST elevated myocardial infarction) [facility])   [place] [facility] [place], Angeline ORN, NP   6 years ago Lower respiratory infection   Primary Care at [facility], [place], PA-C

## 2024-12-08 ENCOUNTER — Other Ambulatory Visit: Payer: Self-pay | Admitting: Internal Medicine

## 2024-12-11 NOTE — Telephone Encounter (Signed)
 Requested Prescriptions  Pending Prescriptions Disp Refills   metoprolol  succinate (TOPROL -XL) 25 MG 24 hr tablet [Pharmacy Med Name: METOPROLOL  SUCC ER 25 MG TAB] 90 tablet 0    Sig: TAKE 1 TABLET (25 MG TOTAL) BY MOUTH DAILY.     Cardiovascular:  Beta Blockers Passed - 12/11/2024  8:42 AM      Passed - Last BP in normal range    BP Readings from Last 1 Encounters:  08/18/24 128/72         Passed - Last Heart Rate in normal range    Pulse Readings from Last 1 Encounters:  08/18/24 (!) 59         Passed - Valid encounter within last 6 months    Recent Outpatient Visits           3 months ago NSTEMI (non-ST elevated myocardial infarction) [facility])   [place] [place], Angeline ORN, NP   6 years ago Lower respiratory infection   [facility] at [facility], [place], PA-C

## 2024-12-23 ENCOUNTER — Other Ambulatory Visit: Payer: Self-pay | Admitting: Internal Medicine

## 2024-12-24 NOTE — Telephone Encounter (Signed)
 Requested Prescriptions  Refused Prescriptions Disp Refills   valACYclovir  (VALTREX ) 500 MG tablet [Pharmacy Med Name: VALACYCLOVIR  HCL 500 MG TABLET] 60 tablet 1    Sig: TAKE 1 TABLET BY MOUTH TWICE A DAY FOR 3 DAYS (LAST REFILL)     Antimicrobials:  Antiviral Agents - Anti-Herpetic Passed - 12/24/2024 11:08 AM      Passed - Valid encounter within last 12 months    Recent Outpatient Visits           4 months ago NSTEMI (non-ST elevated myocardial infarction) [facility])   [place] [facility] [place], Angeline ORN, NP   6 years ago Lower respiratory infection   Primary Care at [facility], [place], PA-C
